# Patient Record
Sex: Male | Born: 1960 | Race: White | Hispanic: No | Marital: Married | Smoking: Current every day smoker
Health system: Southern US, Community
[De-identification: ages and names within clinical notes are randomized; demographics above are authoritative.]

## PROBLEM LIST (undated history)

## (undated) DIAGNOSIS — I4891 Unspecified atrial fibrillation: Secondary | ICD-10-CM

## (undated) DIAGNOSIS — E119 Type 2 diabetes mellitus without complications: Secondary | ICD-10-CM

## (undated) DIAGNOSIS — E785 Hyperlipidemia, unspecified: Secondary | ICD-10-CM

## (undated) DIAGNOSIS — F32A Depression, unspecified: Secondary | ICD-10-CM

## (undated) DIAGNOSIS — F329 Major depressive disorder, single episode, unspecified: Secondary | ICD-10-CM

## (undated) DIAGNOSIS — Z87898 Personal history of other specified conditions: Secondary | ICD-10-CM

## (undated) DIAGNOSIS — K2270 Barrett's esophagus without dysplasia: Secondary | ICD-10-CM

## (undated) DIAGNOSIS — G20A1 Parkinson's disease without dyskinesia, without mention of fluctuations: Secondary | ICD-10-CM

## (undated) DIAGNOSIS — I639 Cerebral infarction, unspecified: Secondary | ICD-10-CM

## (undated) DIAGNOSIS — Z86711 Personal history of pulmonary embolism: Secondary | ICD-10-CM

## (undated) DIAGNOSIS — I10 Essential (primary) hypertension: Secondary | ICD-10-CM

## (undated) DIAGNOSIS — G20 Parkinson's disease: Secondary | ICD-10-CM

## (undated) DIAGNOSIS — E079 Disorder of thyroid, unspecified: Secondary | ICD-10-CM

## (undated) DIAGNOSIS — I519 Heart disease, unspecified: Secondary | ICD-10-CM

## (undated) DIAGNOSIS — D689 Coagulation defect, unspecified: Secondary | ICD-10-CM

## (undated) DIAGNOSIS — F419 Anxiety disorder, unspecified: Secondary | ICD-10-CM

## (undated) HISTORY — DX: Anxiety disorder, unspecified: F41.9

## (undated) HISTORY — DX: Parkinson's disease: G20

## (undated) HISTORY — DX: Personal history of other specified conditions: Z87.898

## (undated) HISTORY — DX: Disorder of thyroid, unspecified: E07.9

## (undated) HISTORY — PX: KNEE ARTHROSCOPY: SUR90

## (undated) HISTORY — DX: Coagulation defect, unspecified: D68.9

## (undated) HISTORY — DX: Essential (primary) hypertension: I10

## (undated) HISTORY — PX: WRIST SURGERY: SHX841

## (undated) HISTORY — PX: BACK SURGERY: SHX140

## (undated) HISTORY — DX: Hyperlipidemia, unspecified: E78.5

## (undated) HISTORY — DX: Cerebral infarction, unspecified: I63.9

## (undated) HISTORY — PX: SPINAL CORD STIMULATOR INSERTION: SHX5378

## (undated) HISTORY — DX: Barrett's esophagus without dysplasia: K22.70

## (undated) HISTORY — DX: Parkinson's disease without dyskinesia, without mention of fluctuations: G20.A1

## (undated) HISTORY — DX: Type 2 diabetes mellitus without complications: E11.9

## (undated) HISTORY — DX: Personal history of pulmonary embolism: Z86.711

## (undated) HISTORY — DX: Depression, unspecified: F32.A

## (undated) HISTORY — DX: Unspecified atrial fibrillation: I48.91

## (undated) HISTORY — DX: Heart disease, unspecified: I51.9

## (undated) HISTORY — PX: CARDIAC CATHETERIZATION: SHX172

## (undated) HISTORY — PX: NECK SURGERY: SHX720

---
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
## 1999-06-03 ENCOUNTER — Encounter: Payer: Self-pay | Admitting: Neurosurgery

## 1999-06-05 ENCOUNTER — Inpatient Hospital Stay (HOSPITAL_COMMUNITY): Admission: RE | Admit: 1999-06-05 | Discharge: 1999-06-06 | Payer: Self-pay | Admitting: Neurosurgery

## 1999-06-05 ENCOUNTER — Encounter: Payer: Self-pay | Admitting: Neurosurgery

## 1999-11-18 ENCOUNTER — Encounter: Payer: Self-pay | Admitting: Neurosurgery

## 1999-11-18 ENCOUNTER — Ambulatory Visit (HOSPITAL_COMMUNITY): Admission: RE | Admit: 1999-11-18 | Discharge: 1999-11-18 | Payer: Self-pay | Admitting: Neurosurgery

## 2002-01-10 ENCOUNTER — Encounter: Payer: Self-pay | Admitting: Unknown Physician Specialty

## 2002-01-10 ENCOUNTER — Encounter: Admission: RE | Admit: 2002-01-10 | Discharge: 2002-01-10 | Payer: Self-pay | Admitting: Unknown Physician Specialty

## 2003-10-04 ENCOUNTER — Ambulatory Visit (HOSPITAL_COMMUNITY): Admission: RE | Admit: 2003-10-04 | Discharge: 2003-10-04 | Payer: Self-pay | Admitting: Orthopedic Surgery

## 2003-10-05 ENCOUNTER — Ambulatory Visit (HOSPITAL_BASED_OUTPATIENT_CLINIC_OR_DEPARTMENT_OTHER): Admission: RE | Admit: 2003-10-05 | Discharge: 2003-10-05 | Payer: Self-pay | Admitting: Orthopedic Surgery

## 2003-10-26 ENCOUNTER — Emergency Department (HOSPITAL_COMMUNITY): Admission: EM | Admit: 2003-10-26 | Discharge: 2003-10-26 | Payer: Self-pay | Admitting: Emergency Medicine

## 2003-11-03 ENCOUNTER — Inpatient Hospital Stay (HOSPITAL_COMMUNITY): Admission: EM | Admit: 2003-11-03 | Discharge: 2003-11-07 | Payer: Self-pay | Admitting: Emergency Medicine

## 2004-04-23 ENCOUNTER — Emergency Department (HOSPITAL_COMMUNITY): Admission: EM | Admit: 2004-04-23 | Discharge: 2004-04-23 | Payer: Self-pay | Admitting: Emergency Medicine

## 2004-07-27 ENCOUNTER — Other Ambulatory Visit: Payer: Self-pay

## 2004-09-16 ENCOUNTER — Ambulatory Visit: Payer: Self-pay | Admitting: Pain Medicine

## 2004-10-30 ENCOUNTER — Ambulatory Visit: Payer: Self-pay | Admitting: Physician Assistant

## 2004-11-26 ENCOUNTER — Ambulatory Visit: Payer: Self-pay | Admitting: Physician Assistant

## 2004-12-26 ENCOUNTER — Ambulatory Visit: Payer: Self-pay | Admitting: Physician Assistant

## 2005-01-24 ENCOUNTER — Ambulatory Visit: Payer: Self-pay | Admitting: Physician Assistant

## 2005-02-24 ENCOUNTER — Ambulatory Visit: Payer: Self-pay | Admitting: Physician Assistant

## 2005-02-26 ENCOUNTER — Ambulatory Visit: Payer: Self-pay | Admitting: Pain Medicine

## 2005-03-26 ENCOUNTER — Ambulatory Visit: Payer: Self-pay | Admitting: Physician Assistant

## 2005-04-23 ENCOUNTER — Ambulatory Visit: Payer: Self-pay | Admitting: Physician Assistant

## 2005-05-05 ENCOUNTER — Ambulatory Visit: Payer: Self-pay | Admitting: Physician Assistant

## 2005-05-14 ENCOUNTER — Ambulatory Visit: Payer: Self-pay | Admitting: Physician Assistant

## 2005-06-12 ENCOUNTER — Ambulatory Visit: Payer: Self-pay | Admitting: Physician Assistant

## 2005-07-29 ENCOUNTER — Ambulatory Visit: Payer: Self-pay | Admitting: Physician Assistant

## 2005-08-04 ENCOUNTER — Inpatient Hospital Stay (HOSPITAL_COMMUNITY): Admission: RE | Admit: 2005-08-04 | Discharge: 2005-08-05 | Payer: Self-pay | Admitting: Neurosurgery

## 2005-08-21 ENCOUNTER — Ambulatory Visit: Payer: Self-pay | Admitting: Physician Assistant

## 2005-09-24 ENCOUNTER — Ambulatory Visit: Payer: Self-pay | Admitting: Physician Assistant

## 2005-10-02 ENCOUNTER — Ambulatory Visit: Payer: Self-pay | Admitting: Physician Assistant

## 2005-10-08 ENCOUNTER — Ambulatory Visit: Payer: Self-pay | Admitting: Physician Assistant

## 2005-10-21 ENCOUNTER — Ambulatory Visit: Payer: Self-pay | Admitting: Physician Assistant

## 2005-11-04 ENCOUNTER — Ambulatory Visit: Payer: Self-pay | Admitting: Physician Assistant

## 2005-12-01 ENCOUNTER — Ambulatory Visit: Payer: Self-pay | Admitting: Physician Assistant

## 2006-01-05 ENCOUNTER — Ambulatory Visit: Payer: Self-pay | Admitting: Physician Assistant

## 2006-02-02 ENCOUNTER — Ambulatory Visit: Payer: Self-pay | Admitting: Physician Assistant

## 2006-03-02 ENCOUNTER — Ambulatory Visit: Payer: Self-pay | Admitting: Physician Assistant

## 2006-03-03 ENCOUNTER — Ambulatory Visit: Payer: Self-pay | Admitting: Pain Medicine

## 2006-03-06 ENCOUNTER — Ambulatory Visit: Payer: Self-pay | Admitting: Physician Assistant

## 2006-03-10 ENCOUNTER — Ambulatory Visit: Payer: Self-pay | Admitting: Pain Medicine

## 2006-04-03 ENCOUNTER — Ambulatory Visit: Payer: Self-pay | Admitting: Physician Assistant

## 2006-04-29 ENCOUNTER — Ambulatory Visit: Payer: Self-pay | Admitting: Physician Assistant

## 2006-05-19 ENCOUNTER — Ambulatory Visit: Payer: Self-pay | Admitting: Pain Medicine

## 2006-05-20 ENCOUNTER — Ambulatory Visit: Payer: Self-pay | Admitting: Pain Medicine

## 2006-05-28 ENCOUNTER — Ambulatory Visit: Payer: Self-pay | Admitting: Physician Assistant

## 2006-06-08 ENCOUNTER — Ambulatory Visit: Payer: Self-pay | Admitting: Pain Medicine

## 2006-06-23 ENCOUNTER — Ambulatory Visit: Payer: Self-pay | Admitting: Physician Assistant

## 2006-07-30 ENCOUNTER — Ambulatory Visit: Payer: Self-pay | Admitting: Physician Assistant

## 2006-08-04 ENCOUNTER — Ambulatory Visit: Payer: Self-pay | Admitting: Physician Assistant

## 2006-08-27 ENCOUNTER — Ambulatory Visit: Payer: Self-pay | Admitting: Physician Assistant

## 2006-09-28 ENCOUNTER — Ambulatory Visit: Payer: Self-pay | Admitting: Pain Medicine

## 2006-10-26 ENCOUNTER — Ambulatory Visit: Payer: Self-pay | Admitting: Physician Assistant

## 2006-11-24 ENCOUNTER — Ambulatory Visit: Payer: Self-pay | Admitting: Physician Assistant

## 2006-12-28 ENCOUNTER — Ambulatory Visit: Payer: Self-pay | Admitting: Physician Assistant

## 2007-01-27 ENCOUNTER — Ambulatory Visit: Payer: Self-pay | Admitting: Pain Medicine

## 2007-02-24 ENCOUNTER — Ambulatory Visit: Payer: Self-pay | Admitting: Physician Assistant

## 2007-03-24 ENCOUNTER — Ambulatory Visit: Payer: Self-pay | Admitting: Physician Assistant

## 2007-03-25 ENCOUNTER — Ambulatory Visit (HOSPITAL_BASED_OUTPATIENT_CLINIC_OR_DEPARTMENT_OTHER): Admission: RE | Admit: 2007-03-25 | Discharge: 2007-03-26 | Payer: Self-pay | Admitting: Orthopedic Surgery

## 2007-04-23 ENCOUNTER — Ambulatory Visit: Payer: Self-pay | Admitting: Physician Assistant

## 2007-05-24 ENCOUNTER — Ambulatory Visit: Payer: Self-pay | Admitting: Physician Assistant

## 2007-06-22 ENCOUNTER — Ambulatory Visit: Payer: Self-pay | Admitting: Physician Assistant

## 2007-07-21 ENCOUNTER — Ambulatory Visit: Payer: Self-pay | Admitting: Physician Assistant

## 2007-08-23 ENCOUNTER — Ambulatory Visit: Payer: Self-pay | Admitting: Physician Assistant

## 2007-09-22 ENCOUNTER — Ambulatory Visit: Payer: Self-pay | Admitting: Pain Medicine

## 2007-12-21 ENCOUNTER — Ambulatory Visit: Payer: Self-pay | Admitting: Physician Assistant

## 2008-02-09 ENCOUNTER — Ambulatory Visit: Payer: Self-pay | Admitting: Physician Assistant

## 2008-02-17 ENCOUNTER — Encounter: Admission: RE | Admit: 2008-02-17 | Discharge: 2008-02-17 | Payer: Self-pay | Admitting: Orthopedic Surgery

## 2008-03-15 ENCOUNTER — Ambulatory Visit: Payer: Self-pay | Admitting: Physician Assistant

## 2008-06-06 ENCOUNTER — Ambulatory Visit: Payer: Self-pay | Admitting: Physician Assistant

## 2008-07-18 ENCOUNTER — Ambulatory Visit: Payer: Self-pay | Admitting: Physician Assistant

## 2008-10-04 ENCOUNTER — Ambulatory Visit: Payer: Self-pay | Admitting: Pain Medicine

## 2009-01-09 ENCOUNTER — Ambulatory Visit: Payer: Self-pay | Admitting: Physician Assistant

## 2009-04-10 ENCOUNTER — Ambulatory Visit: Payer: Self-pay | Admitting: Physician Assistant

## 2009-07-11 ENCOUNTER — Ambulatory Visit: Payer: Self-pay | Admitting: Physician Assistant

## 2009-08-08 ENCOUNTER — Ambulatory Visit: Payer: Self-pay | Admitting: Physician Assistant

## 2009-10-11 ENCOUNTER — Ambulatory Visit: Payer: Self-pay | Admitting: Physician Assistant

## 2009-11-13 ENCOUNTER — Ambulatory Visit: Payer: Self-pay | Admitting: Physician Assistant

## 2009-12-12 ENCOUNTER — Ambulatory Visit: Payer: Self-pay | Admitting: Physician Assistant

## 2010-02-04 ENCOUNTER — Ambulatory Visit: Payer: Self-pay | Admitting: Pain Medicine

## 2010-05-06 ENCOUNTER — Ambulatory Visit: Payer: Self-pay | Admitting: Pain Medicine

## 2011-01-05 ENCOUNTER — Encounter: Payer: Self-pay | Admitting: Family Medicine

## 2011-05-02 NOTE — Op Note (Signed)
Charles Huynh, Charles Huynh                ACCOUNT NO.:  [id number]   MEDICAL RECORD NO.:  [id number]          PATIENT TYPE:  AMB   LOCATION:  DSC                          FACILITY:  [facility]   PHYSICIAN:  Cindee Salt, M.D.       DATE OF BIRTH:  [birth year]   DATE OF PROCEDURE:  03/25/2007  DATE OF DISCHARGE:                               OPERATIVE REPORT   PREOPERATIVE DIAGNOSIS:  Triangular fibrocartilage complex tear, left  wrist.   POSTOPERATIVE DIAGNOSES:  1. Triangular fibrocartilage complex tear, left wrist.  2. Detachment of triangular fibrocartilage from fovea, left wrist.   OPERATION:  1. Arthroscopy.  2. Debridement, triangular fibrocartilage tear, with reattachment of      triangular fibrocartilage to bone, left wrist, using arthroscopic      technique.   SURGEON:  Cindee Salt, MD   ASSISTANT:  Carolyne Fiscal.   ANESTHESIA:  Supraclavicular with local.   ANESTHESIOLOGIST:  W. Autumn Patty, MD   HISTORY:  The patient is a 50 year old male with a long history of  bilateral wrist pain.  He has undergone repair of triangle  fibrocartilage complex on his right side followed by a very significant  complex regional pain.  He is admitted now for arthroscopy of his left  with debridement pain-free TFCC tear, possible repair as dictated by  findings.   PROCEDURE:  The patient was seen in the preoperative area, questions  encouraged and answered, and preoperatively he is aware of risks and  complications, possibility of recurrence of the complex regional pain,  the possibility of continued pain, incomplete relief of symptoms, injury  to arteries, nerves, tendons, infection, dystrophy.  He has elected to  proceed to have this performed.  The extremity is marked by both the  patient and surgeon and antibiotic given.   PROCEDURE:  The patient was brought to the operating room,  where a  supraclavicular block was carried out without difficulty.  He was  prepped using DuraPrep, supine  position, left arm free.  The limb was  placed in an arthroscopy tower, 10 pounds of traction applied, the joint  inflated through the 3/4 portal.  The 3/4 portal was then opened after  localization, taking care to protect the extensor pollicis longus  tendon.  The transverse incision was made through skin only, deepened  with a hemostat, a blunt trocar was used enter the joint.  The joint was  inspected.  A small tear in the radial volar ligaments was noted.  The  cartilage showed no changes.  The scapholunate ligament was intact.  The  ulnar lunate facette showed no changes, nor did the lunate.  The  lunotriquetral joint was intact.  A triangle fibrocartilage central  transverse tear was present.  This did not appear to be degenerative in  nature.  The cartilage showed no trampoline effect and was extremely  floppy.  Irrigation catheter was placed in 6U.  A 6R portal was opened,  a probe entered.  This was then used to probe the triangle  fibrocartilage.  There was no trampoline effect present.  This cartilage  was  found to be attached at the periphery.  The midcarpal joint was then  inspected through the ulnar midcarpal portal after localization and  inflation with a 22-gauge needle.  The midcarpal joint was inspected and  no significant laxity of either the lunotriquetral or scapholunate  ligament was noted.  There were no cartilaginous changes present.  The  scope was reintroduced.  A 4/5 portal was then opened after localization  with a 22-gauge needle.  A full radius shaver and Arthrowand were then  introduced and used to debride the triangle fibrocartilage complex.  Significant instability was noted to the distal radioulnar joint.  Once  this was completed, which was present preoperatively, once anesthesia  was afforded, the fovea was then probed.  This was found to be  incompetent and it was decided to proceed with reattachment.  The area  of the fovea was then roughened with a  full radius shaver and  Arthrowand.  Under image intensification, two 18-gauge needles were then  passed through the ulnar aspect of the distal ulna just proximal to the  styloid and noted to enter up through the triangle fibrocartilage  complex.  A 28-gauge needle was then used to clear the bone from the  needles.  A 30-gauge monofilament wire doubled over for a loop was then  passed through one and a 2-0 PDS suture passed through the opposite.  A  grasper was then introduced and both suture and wire loop were brought  out through the 4/5 portal, looped through the skin.  The needles  removed and the wire was then used to deliver the PDS suture through the  drill holes.  An incision was then made on the ulnar side of the wrist  at the distal ulna between the two sutures.  This was bluntly dissected  down to bone, then along the bone the two sutures were then brought into  this wound and tied over the ulna.  This was done while observing the  tightening of the triangular fibrocartilage complex back into the fovea.  Significant stability was then afforded to the distal radioulnar joint  on testing.  The sutures were cut.  A full radius shaver was then  introduced.  The two bone pieces which were forced out through the  needles were removed and a smoothing of the triangular fibrocartilage  complex was completed.  The instruments were removed.  The portals were  closed with interrupted 5-0 Vicryl Rapide sutures.  A sterile  compressive dressing and a long-arm splint applied.  The patient  tolerated the procedure well and was taken to the recovery room for  observation in satisfactory condition.  He is admitted for overnight  stay.  He will be blocked again in the morning in an effort to prevent  his complex regional pain from recurring.  He will be discharged on  Percocet to return to the [facility] in 1 week.          ______________________________  Cindee Salt,  M.D.     GK/MEDQ  D:  03/25/2007  T:  03/25/2007  Job:  [id number]

## 2011-05-02 NOTE — Op Note (Signed)
NAMELUQMAN, PERRELLI                ACCOUNT NO.:  [id number]   MEDICAL RECORD NO.:  [id number]          PATIENT TYPE:  INP   LOCATION:  2866                         FACILITY:  [facility]   PHYSICIAN:  Cristi Loron, M.D.DATE OF BIRTH:  Jul 24, 1961   DATE OF PROCEDURE:  08/04/2005  DATE OF DISCHARGE:                                 OPERATIVE REPORT   CONTINUATION:  We incised the C5-6 intervertebral disk with a 15 blade  scalpel and then performed partial diskectomy using pituitary forceps and  Carlens curettes.  We inserted distraction screws at C5 and C6 and then  distracted the interspace.  We then used a high speed drill to decorticate  the vertebral end plate at U9-8 to drill away the remainder of C5-6  intervertebral disk, to thin out the posterior longitudinal ligament and  drill away some posterior spondylosis.  We then incised the ligament with  the arachnoid knife and then removed it with a Kerrison punch undercutting  the vertebral end plates at C6, decompressing the thecal sac.  I then  performed a generous foraminotomy around the bilateral C6 nerve roots  completing the decompression.   We now turned our attention to the arthrodesis, obtained iliac crest  tricortical allograft bone graft and fashioned it to these approximate  dimensions.   DICTATION ENDED HERE.      Cristi Loron, M.D.  Electronically Signed     JDJ/MEDQ  D:  08/04/2005  T:  08/05/2005  Job:  [id number]

## 2011-05-02 NOTE — Op Note (Signed)
[name], [name]                ACCOUNT NO.:  [id number]   MEDICAL RECORD NO.:  [id number]          PATIENT TYPE:  INP   LOCATION:  2866                         FACILITY:  [facility]   PHYSICIAN:  Lonna Cobb, N.P.      DATE OF BIRTH:  18-Mar-1961   DATE OF PROCEDURE:  08/04/2005  DATE OF DISCHARGE:                                 OPERATIVE REPORT   CONTINUATION:  We then performed a generous foraminotomy about the bilateral  C6 nerve roots completing the decompression.   We now turned attention to the arthrodesis.  We obtained iliac crest  tricortical allograft bone graft and fashioned these into these approximate  dimensions.  8 mm height, 1 cm depth. We inserted the bone graft into the  distracted C5-6 interspace and then removed the distraction screws.  There  was good snug fit of the bone graft.   We now turned our attention to anterior spinal instrumentation.  We used a  high speed drill to remove some ventral spondylosis from the C5-6 interspace  so that the plate would lie down flat.  We selected the appropriate length  Codman Slim Lock anterior cervical plate.  We laid it along the anterior  aspect of the vertebral bodies at C5-C6.  We placed a 14 mm self tapping  screw at C7 on the left.  Because the previous screw was somewhat crooked on  cervical spine, we decided to drill a new hole at C7 on left.  We drilled a  14 mm hole and then placed a second 14 mm self-tapping screw at this level.  Because it was near to the previous hole, we used a rescue screw but there  was good bony purchase.  We then drilled two more 14 mm holes at C5 and  placed two 14 mm self tapping screws at C5 completing instrumentation.  We  obtained an intraoperative radiograph and there was limited visualization on  that radiograph; however, it looked good in vivo.  We there secured the  screws to the plate by locking each cam.   We now carefully provided hemostasis by using bipolar electrocautery.  We  removed the Caspar self-retaining retractor and inspected the esophagus.  There was no apparent damage.  We then reapproximated the patient's platysma  muscle with interrupted 3-0 Vicryl suture, subcutaneous tissue with  interrupted 3-0 Vicryl suture and the skin with Steri-Strips and benzoin.  The wound was then coated with bacitracin ointment and sterile dressing  applied.  The drapes were removed.  The patient was subsequently extubated  by the anesthesia team and transported to the post anesthesia care unit in  stable condition.  All sponge, needle and instrument counts were correct at  the end of this case.      Lonna Cobb, N.P.     LT/MEDQ  D:  08/04/2005  T:  08/05/2005  Job:  [id number]

## 2011-05-02 NOTE — Discharge Summary (Signed)
NAMETREVONNE, NYLAND                            ACCOUNT NO.:  [id number]   MEDICAL RECORD NO.:  [id number]                   PATIENT TYPE:  INP   LOCATION:                                       FACILITY:  [facility]   PHYSICIAN:  Cindee Salt, M.D.                    DATE OF BIRTH:  1961-10-30   DATE OF ADMISSION:  11/03/2003  DATE OF DISCHARGE:  11/07/2003                                 DISCHARGE SUMMARY   ADMISSION DIAGNOSIS:  Pain and swelling, possible infection right wrist,  status post arthroscopy.   DISCHARGE DIAGNOSIS:  Pain and swelling, possible infection right wrist,  status post arthroscopy.   OPERATION:  None.   HISTORY:  The patient is a 50 year old male with a history of arthroscopy of  his wrist.  He has had progressive pain and swelling of his wrist, along  with increasing discomfort.  Pull out sutures were removed.  He is admitted  for observation, antibiotics and laboratory work.   PHYSICAL EXAMINATION:  GENERAL:  Well-developed, well-nourished male in no  acute distress.  CHEST:  Clear.  HEART:  Regular.  EXTREMITIES:  Swelling of wrist.   HOSPITAL COURSE:  He was admitted.  CBC was 8.6, sedimentation rate was 8.  He was placed on Ancef.  The swelling diminished progressively, pain  diminished.  He showed good improvement.  He is discharged on November 07, 2003.   DISCHARGE MEDICATIONS:  Vicodin and Keflex.   FOLLOWUP:  To return to the office.                                                Cindee Salt, M.D.    Angelique Blonder  D:  12/18/2003  T:  12/18/2003  Job:  [id number]

## 2011-05-02 NOTE — Op Note (Signed)
Charles Huynh, Charles Huynh                ACCOUNT NO.:  [id number]   MEDICAL RECORD NO.:  [id number]          PATIENT TYPE:  INP   LOCATION:  2866                         FACILITY:  [facility]   PHYSICIAN:  Cristi Loron, M.D.DATE OF BIRTH:  06-14-1961   DATE OF PROCEDURE:  08/04/2005  DATE OF DISCHARGE:                                 OPERATIVE REPORT   BRIEF HISTORY:  The patient is a 50 year old white male who has previously  undergone a C6-7 anterior cervical discectomy, fusion and plating by another  physician approximately six years ago.  He did well with that surgery, but  subsequently was injured in a work related injury, suffering a severe  shoulder injury as well as a C5-6 herniated disc.  He failed medical  management of his herniated disc and he is also considered a candidate for a  cervical epidural spinal cord stimulator.  I discussed the situation with  him and recommended and consider a C5-6 anterior cervical discectomy and  fusion and plating.  The patient weighed the risks, benefits, and  alternatives to surgery and decided to proceed with a C5-6 anterior cervical  discectomy, fusion and plating.   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   POSTOPERATIVE DIAGNOSIS:  C5-6 herniated nucleus pulposus, stenosis,  cervical radiculopathy, cervicalgia.   PROCEDURE:  C5-6 extensive anterior cervical discectomy/decompression;  interbody iliac crest allograft arthrodesis; anterior cervical plating  (Codman's SLIM-LOC titanium plate and screws).   SURGEON:  Cristi Loron, M.D.   ASSISTANT:  Hilda Lias, M.D.   ANESTHESIA:  General endotracheal anesthesia.   ESTIMATED BLOOD LOSS:  50 mL.   SPECIMENS:  None.   DRAINS:  None.   COMPLICATIONS:  None.   DESCRIPTION OF PROCEDURE:  The patient was brought to the operating room by  anesthesia team.  General endotracheal anesthesia was induced.  Patient  remained in the supine position.  A roll was placed under his shoulders to  place the neck in slight extension.  His anterior cervical region was then  shaved and prepared with Betadine scrub and Betadine solution.  Sterile  drapes were applied.  I then injected the area to be incised with Marcaine  with epinephrine solution.  I used a scalpel to make a transverse incision  in his left anterior neck; i.e., through his previous surgical incision.  I  used the Metzenbaum scissors to dissect through the scar tissue to divide  the platysma muscle and carefully dissect medial to sternocleidomastoid  muscle, jugular vein and carotid artery.  I carefully dissected down toward  the anterior cervical spine, carefully identifying the esophagus and  retracting medially with the hand-held retractors and clearing the soft  tissue from the anterior cervical spine, exposing underlying scar tissue  from the prior surgery.  I inserted a bent spinal needle and exposed disc  space and obtained intraoperative radiograph to confirm our location.  I  then used the 15 blade scalpel to remove some of the scar tissue from over  the pervious plate at W4-1.  I then used the screwdriver to  remove the  interlocking screw and then the screw, then the plate.  I then used  electrocautery to detach the medial border of the longus colli muscle  bilaterally from C5-6 intervertebral disc space.  I inserted the Caspar self-  retaining retractor for exposure.  We began the decompression by incising  the C5-6 intervertebral disc space with a 15 blade scalpel and performed a  partial discectomy using the pituitary forceps and the Karlin curettes.  We  inserted distraction screws at the ...   Dictation ended at this point.      Cristi Loron, M.D.  Electronically Signed     JDJ/MEDQ  D:  08/04/2005  T:  08/05/2005  Job:  [id number]

## 2015-12-28 DIAGNOSIS — N138 Other obstructive and reflux uropathy: Secondary | ICD-10-CM | POA: Insufficient documentation

## 2015-12-28 DIAGNOSIS — N401 Enlarged prostate with lower urinary tract symptoms: Secondary | ICD-10-CM

## 2015-12-28 HISTORY — DX: Benign prostatic hyperplasia with lower urinary tract symptoms: N13.8

## 2015-12-28 HISTORY — DX: Benign prostatic hyperplasia with lower urinary tract symptoms: N40.1

## 2017-07-09 DIAGNOSIS — G20 Parkinson's disease: Secondary | ICD-10-CM | POA: Insufficient documentation

## 2017-07-09 DIAGNOSIS — I639 Cerebral infarction, unspecified: Secondary | ICD-10-CM | POA: Insufficient documentation

## 2017-07-09 DIAGNOSIS — E039 Hypothyroidism, unspecified: Secondary | ICD-10-CM

## 2017-07-09 DIAGNOSIS — E119 Type 2 diabetes mellitus without complications: Secondary | ICD-10-CM | POA: Insufficient documentation

## 2017-07-09 DIAGNOSIS — R7989 Other specified abnormal findings of blood chemistry: Secondary | ICD-10-CM | POA: Insufficient documentation

## 2017-07-09 DIAGNOSIS — G894 Chronic pain syndrome: Secondary | ICD-10-CM | POA: Insufficient documentation

## 2017-07-09 DIAGNOSIS — I4891 Unspecified atrial fibrillation: Secondary | ICD-10-CM | POA: Insufficient documentation

## 2017-07-09 DIAGNOSIS — G20A1 Parkinson's disease without dyskinesia, without mention of fluctuations: Secondary | ICD-10-CM | POA: Insufficient documentation

## 2017-07-09 HISTORY — DX: Type 2 diabetes mellitus without complications: E11.9

## 2017-07-09 HISTORY — DX: Chronic pain syndrome: G89.4

## 2017-07-09 HISTORY — DX: Cerebral infarction, unspecified: I63.9

## 2017-07-09 HISTORY — DX: Hypothyroidism, unspecified: E03.9

## 2017-07-09 HISTORY — DX: Other specified abnormal findings of blood chemistry: R79.89

## 2017-09-02 DIAGNOSIS — K219 Gastro-esophageal reflux disease without esophagitis: Secondary | ICD-10-CM | POA: Insufficient documentation

## 2017-09-02 HISTORY — DX: Gastro-esophageal reflux disease without esophagitis: K21.9

## 2019-08-30 ENCOUNTER — Ambulatory Visit (INDEPENDENT_AMBULATORY_CARE_PROVIDER_SITE_OTHER): Payer: Medicare Other | Admitting: Cardiology

## 2019-08-30 ENCOUNTER — Encounter: Payer: Self-pay | Admitting: Cardiology

## 2019-08-30 ENCOUNTER — Other Ambulatory Visit: Payer: Self-pay

## 2019-08-30 VITALS — BP 116/64 | HR 64 | Ht 71.0 in | Wt 173.0 lb

## 2019-08-30 DIAGNOSIS — E1140 Type 2 diabetes mellitus with diabetic neuropathy, unspecified: Secondary | ICD-10-CM

## 2019-08-30 DIAGNOSIS — Z01812 Encounter for preprocedural laboratory examination: Secondary | ICD-10-CM | POA: Diagnosis not present

## 2019-08-30 DIAGNOSIS — I10 Essential (primary) hypertension: Secondary | ICD-10-CM | POA: Insufficient documentation

## 2019-08-30 DIAGNOSIS — R0600 Dyspnea, unspecified: Secondary | ICD-10-CM | POA: Diagnosis not present

## 2019-08-30 DIAGNOSIS — R079 Chest pain, unspecified: Secondary | ICD-10-CM | POA: Diagnosis not present

## 2019-08-30 DIAGNOSIS — I4891 Unspecified atrial fibrillation: Secondary | ICD-10-CM | POA: Diagnosis not present

## 2019-08-30 DIAGNOSIS — Z8249 Family history of ischemic heart disease and other diseases of the circulatory system: Secondary | ICD-10-CM

## 2019-08-30 HISTORY — DX: Essential (primary) hypertension: I10

## 2019-08-30 MED ORDER — NITROGLYCERIN 0.4 MG SL SUBL: 0.4000 mg | SUBLINGUAL_TABLET | SUBLINGUAL | 5 refills | Status: DC | PRN

## 2019-08-30 NOTE — Progress Notes (Signed)
Cardiology Office Note:    Date:  08/31/2019   ID:  Charles Huynh, DOB 10/01/1961, MRN [id number]  PCP:  Patient, No Pcp Per  Cardiologist:  No primary care provider on file.  Electrophysiologist:  None   Referring MD: No ref. provider found   The patient was referred by his primary care physician for chest discomfort and palpitations.  History of Present Illness:    Charles Huynh is a 58 y.o. male with a hx of paroxysmal atrial fibrillation (he is on Eliquis), hypertension, diabetes not on insulin, hyperlipidemia, chronic smoker presents to be evaluated for chest discomfort.  Patient for the last 2 weeks he has been experiencing chest discomfort with sexual intercourse.  He notes that in the middle sexual intercourse he starts to get mid chest diffuse chest pressure with associated shortness of breath.  He notes that once this started he gets an abrupt onset of palpitations.  He states that whenever he stop and rest it takes about 10 to 15 minutes and resolves.  He denies any aggravating or relieving factors. Of note the patient states that he thinks that at this time he is going back into atrial fibrillation as he feels exactly like when it used to be when he was in and out of A. Fib.  No chest discomfort in the office today.    Past Medical History:  Diagnosis Date  . Atrial fibrillation (HCC)   . Hx pulmonary embolism   . Hyperlipidemia   . Hypertension   . Parkinson's disease (HCC)   . Stroke (HCC)   . Thyroid disease     Past Surgical History:  Procedure Laterality Date  . BACK SURGERY    . CARDIAC CATHETERIZATION    . KNEE ARTHROSCOPY Left   . NECK SURGERY    . SPINAL CORD STIMULATOR INSERTION    . WRIST SURGERY Bilateral     Current Medications: Current Meds  Medication Sig  . baclofen (LIORESAL) 10 MG tablet TK 1 T PO BID  . carbidopa-levodopa (SINEMET CR) 50-200 MG tablet TK 2 TS PO BID  . citalopram (CELEXA) 40 MG tablet TK 1 T PO QD  . DILT-XR 120 MG 24  hr capsule TK 1 C PO QD  . diphenhydrAMINE (BENADRYL) 25 mg capsule Take 25 mg by mouth at bedtime.  Marland Kitchen ELIQUIS 5 MG TABS tablet Take 5 mg by mouth 2 (two) times daily.  Marland Kitchen esomeprazole (NEXIUM) 40 MG capsule Take 40 mg by mouth daily at 12 noon.  . fenofibrate 160 MG tablet TK 1 T PO QD  . fentaNYL (DURAGESIC) 50 MCG/HR 1 patch as directed. Apply 1 patch every 2.5 days  . hydrochlorothiazide (MICROZIDE) 12.5 MG capsule TK 1 C PO QD  . JANUVIA 100 MG tablet Take 100 mg by mouth daily.  Marland Kitchen levothyroxine (SYNTHROID) 50 MCG tablet TK 1 T PO QD  . loratadine (CLARITIN) 10 MG tablet Take 10 mg by mouth daily.  Marland Kitchen lovastatin (MEVACOR) 20 MG tablet Take 1 tablet by mouth daily.  . metFORMIN (GLUCOPHAGE) 500 MG tablet TAKE 2 TABLETS PO BID  . metoCLOPramide (REGLAN) 10 MG tablet TK 1 T PO BID  . metoprolol tartrate (LOPRESSOR) 100 MG tablet TAKE 1 TABLET PO BID  . oxyCODONE-acetaminophen (PERCOCET) 10-325 MG tablet Take 1 tablet by mouth 3 (three) times daily.  . potassium chloride (K-DUR) 10 MEQ tablet TK 1 T PO QD  . pregabalin (LYRICA) 300 MG capsule TK ONE C PO Q 12 H  .  promethazine (PHENERGAN) 25 MG tablet Take 25 mg by mouth every 6 (six) hours as needed for nausea or vomiting.  Marland Kitchen TRIAZOLAM PO Take 1 tablet by mouth at bedtime.     Allergies:   Aleve [naproxen sodium]   Social History   Socioeconomic History  . Marital status: Married    Spouse name: Not on file  . Number of children: Not on file  . Years of education: Not on file  . Highest education level: Not on file  Occupational History  . Not on file  Social Needs  . Financial resource strain: Not on file  . Food insecurity    Worry: Not on file    Inability: Not on file  . Transportation needs    Medical: Not on file    Non-medical: Not on file  Tobacco Use  . Smoking status: Current Every Day Smoker    Packs/day: 1.00    Years: 47.00    Pack years: 47.00    Types: Cigarettes  . Smokeless tobacco: Never Used   Substance and Sexual Activity  . Alcohol use: Yes    Comment: very seldom; 1 -2 times per year  . Drug use: Never  . Sexual activity: Not on file  Lifestyle  . Physical activity    Days per week: Not on file    Minutes per session: Not on file  . Stress: Not on file  Relationships  . Social Musician on phone: Not on file    Gets together: Not on file    Attends religious service: Not on file    Active member of club or organization: Not on file    Attends meetings of clubs or organizations: Not on file    Relationship status: Not on file  Other Topics Concern  . Not on file  Social History Narrative  . Not on file     Family History: The patient's family history includes Emphysema in his father; Heart attack in his father; Heart disease in his father; Lung cancer in his mother.  ROS:   .   Review of Systems  Constitution: Negative for decreased appetite and weight loss.  Eyes: Negative for blurred vision, redness and vision loss in right eye.  Cardiovascular: Positive for chest pain and palpitations. Negative for dyspnea on exertion.  Respiratory: Negative for cough, snoring and wheezing.   Endocrine: Negative for heat intolerance and polydipsia.  Skin: Negative for itching, nail changes and skin cancer.  Musculoskeletal: Negative for arthritis, joint pain and muscle cramps.  Gastrointestinal: Negative for bloating, diarrhea, flatus, heartburn, nausea and vomiting.  Genitourinary: Negative for flank pain and incomplete emptying.  Neurological: Negative for difficulty with concentration, focal weakness and headaches.  Psychiatric/Behavioral: Negative for depression, memory loss and substance abuse.  Allergic/Immunologic: Negative for HIV exposure and hives.     EKGs/Labs/Other Studies Reviewed:    The following studies were reviewed today:   EKG: The ekg ordered today demonstrates sinus rhythm, heart rate 68 bpm, with underlying left bundle branch block,  and evidence of left atrial enlargement.  Left bundle branch block present on EKG performed in August 2005.  Recent Labs: No recent labs Recent Lipid Panel No recent lipid panel.  Physical Exam:    VS:  BP 116/64 (BP Location: Right Arm, Patient Position: Sitting, Cuff Size: Normal)   Pulse 64   Ht 5\' 11"  (1.803 m)   Wt 173 lb (78.5 kg)   SpO2 95%   BMI 24.13  kg/m     Wt Readings from Last 3 Encounters:  08/30/19 173 lb (78.5 kg)     GEN: Well nourished, well developed in no acute distress HEENT: Normal NECK: No JVD; No carotid bruits LYMPHATICS: No lymphadenopathy CARDIAC: RRR, no murmurs, rubs, gallops RESPIRATORY:  Clear to auscultation without rales, wheezing or rhonchi  ABDOMEN: Soft, non-tender, non-distended EXTREMITIES: No cyanosis, no clubbing, no edema MUSCULOSKELETAL:  No edema; No deformity  SKIN: Warm and dry, tattoo on left lower extremity NEUROLOGIC:  Alert and oriented x 3 PSYCHIATRIC:  Normal affect, good insight  ASSESSMENT:    1. Atrial fibrillation, unspecified type (HCC)   2. Chest pain, unspecified type   3. Dyspnea, unspecified type   4. Pre-procedure lab exam   5. Essential hypertension   6. Family history of coronary artery disease   7. Type 2 diabetes mellitus with diabetic neuropathy, without long-term current use of insulin (HCC)    PLAN:    At this time I am going to go ahead and pursue an ischemic evaluation in this patient who is at risk for coronary artery disease. I have a CTA of the coronaries and he is in agreement to this testing. He denies allergies to IV contrast. Risk and benefit review with patient.  A TTE is also appropriate in the setting of his shortness of breath as well as longstanding hypertension to assess RV/LV function and structural abnormalities.   A 7-day ZIO patch has been ordered to assess for any heart rate scratching as well as rhythm changes.  For now sublingual nitroglycerin as needed will be ordered.  I have  counseled patient on the use of the nitroglycerin as well as to avoid using any phosphodiesterase inhibitors ( like Viagra or Cialis).  He denies the use of any of these medication.  Blood work has been ordered, he will get this done the same day is scheduled for his TTE.  He is a current smoker and is willing to quit smoking - for now he has set a goal to quit smoking before christmas.   He is in agreement with the above plan.  Follow-up in 3 months.   Medication Adjustments/Labs and Tests Ordered: Current medicines are reviewed at length with the patient today.  Concerns regarding medicines are outlined above.  Orders Placed This Encounter  Procedures  . CT CORONARY FRACTIONAL FLOW RESERVE DATA PREP  . CT CORONARY FRACTIONAL FLOW RESERVE FLUID ANALYSIS  . CT CORONARY MORPH W/CTA COR W/SCORE W/CA W/CM &/OR WO/CM  . Basic Metabolic Panel (BMET)  . Basic Metabolic Panel (BMET)  . Magnesium  . TSH  . Lipid Profile  . HgB A1c  . LONG TERM MONITOR (3-14 DAYS)  . EKG 12-Lead  . ECHOCARDIOGRAM COMPLETE   Meds ordered this encounter  Medications  . nitroGLYCERIN (NITROSTAT) 0.4 MG SL tablet    Sig: Place 1 tablet (0.4 mg total) under the tongue every 5 (five) minutes as needed for chest pain.    Dispense:  25 tablet    Refill:  5    Patient Instructions  Medication Instructions:  Nitroglycerin 0.4 mg sublingual (under your tongue) as needed for chest pain. If experiencing chest pain, stop what you are doing and sit down. Take 1 nitroglycerin and wait 5 minutes. If chest pain continues, take another nitroglycerin and wait 5 minutes. If chest pain does not subside, take 1 more nitroglycerin and dial 911. You make take a total of 3 nitroglycerin in a 15 minute time  frame.  If you need a refill on your cardiac medications before your next appointment, please call your pharmacy.   Lab work: Your physician recommends that you return for lab work in:   3-7 days prior to CT: BMP  When you  have your echo: BMP,TSH,LIPID,Magnesium  If you have labs (blood work) drawn today and your tests are completely normal, you will receive your results only by: Marland Kitchen. MyChart Message (if you have MyChart) OR . A paper copy in the mail If you have any lab test that is abnormal or we need to change your treatment, we will call you to review the results.  Testing/Procedures: Your physician has requested that you have an echocardiogram. Echocardiography is a painless test that uses sound waves to create images of your heart. It provides your doctor with information about the size and shape of your heart and how well your heart's chambers and valves are working. This procedure takes approximately one hour. There are no restrictions for this procedure.  Your physician has recommended that you wear a ZIO monitor. ZIO monitors are medical devices that record the heart's electrical activity. Doctors most often use these monitors to diagnose arrhythmias. Arrhythmias are problems with the speed or rhythm of the heartbeat. The monitor is a small, portable device. You can wear one while you do your normal daily activities. This is usually used to diagnose what is causing palpitations/syncope (passing out).  Wear 7 days  Your physician has requested that you have cardiac CT. Cardiac computed tomography (CT) is a painless test that uses an x-ray machine to take clear, detailed pictures of your heart. For further information please visit [email or web address] Please follow instruction sheet as given.  Your cardiac CT will be scheduled at one of the below locations:   [facility] [address] [place], [place] [address] [phone]  If scheduled at [facility], please arrive at the [facility] main entrance of [facility] 30-45 minutes prior to test start time. Proceed to the [facility] Radiology Department (first floor) to check-in and test prep.  .  Please follow these  instructions carefully (unless otherwise directed):  Hold all erectile dysfunction medications at least 3 days (72 hrs) prior to test.  On the Night Before the Test: . Be sure to Drink plenty of water. . Do not consume any caffeinated/decaffeinated beverages or chocolate 12 hours prior to your test. . Do not take any antihistamines 12 hours prior to your test.   On the Day of the Test: . Drink plenty of water. Do not drink any water within one hour of the test. . Do not eat any food 4 hours prior to the test. . You may take your regular medications prior to the test.  . Take metoprolol (Lopressor) two hours prior to test. . HOLD Furosemide/Hydrochlorothiazide morning of the test.                  -If HR is less than 55 BPM- No Beta Blocker                -IF HR is greater than 55 BPM and patient is less than or equal to 60 yrs old Lopressor 100mg  x1.      After the Test: . Drink plenty of water. . After receiving IV contrast, you may experience a mild flushed feeling. This is normal. . On occasion, you may experience a mild rash up to 24 hours after the test. This  is not dangerous. If this occurs, you can take Benadryl 25 mg and increase your fluid intake. . If you experience trouble breathing, this can be serious. If it is severe call 911 IMMEDIATELY. If it is mild, please call our office. . If you take any of these medications: Glipizide/Metformin, Avandament, Glucavance, please do not take 48 hours after completing test unless otherwise instructed.    Please contact the cardiac imaging nurse navigator should you have any questions/concerns Marchia Bond, RN Navigator Cardiac Imaging [facility] [phone] Office  [phone] Cell   Follow-Up: At [facility], you and your health needs are our priority.  As part of our continuing mission to provide you with exceptional heart care, we have created designated Provider Care Teams.  These Care Teams  include your primary Cardiologist (physician) and Advanced Practice Providers (APPs -  Physician Assistants and Nurse Practitioners) who all work together to provide you with the care you need, when you need it. You will need a follow up appointment in 3 months with Dr Colbe Viviano/ Any Other Special Instructions Will Be Listed Below (If Applicable).       Rolly Pancake, DO  08/31/2019 9:19 AM    [facility]

## 2019-08-30 NOTE — Patient Instructions (Addendum)
Medication Instructions:  Nitroglycerin 0.4 mg sublingual (under your tongue) as needed for chest pain. If experiencing chest pain, stop what you are doing and sit down. Take 1 nitroglycerin and wait 5 minutes. If chest pain continues, take another nitroglycerin and wait 5 minutes. If chest pain does not subside, take 1 more nitroglycerin and dial 911. You make take a total of 3 nitroglycerin in a 15 minute time frame.  If you need a refill on your cardiac medications before your next appointment, please call your pharmacy.   Lab work: Your physician recommends that you return for lab work in:   3-7 days prior to CT: BMP  When you have your echo: BMP,TSH,LIPID,Magnesium  If you have labs (blood work) drawn today and your tests are completely normal, you will receive your results only by: Marland Kitchen. MyChart Message (if you have MyChart) OR . A paper copy in the mail If you have any lab test that is abnormal or we need to change your treatment, we will call you to review the results.  Testing/Procedures: Your physician has requested that you have an echocardiogram. Echocardiography is a painless test that uses sound waves to create images of your heart. It provides your doctor with information about the size and shape of your heart and how well your heart's chambers and valves are working. This procedure takes approximately one hour. There are no restrictions for this procedure.  Your physician has recommended that you wear a ZIO monitor. ZIO monitors are medical devices that record the heart's electrical activity. Doctors most often use these monitors to diagnose arrhythmias. Arrhythmias are problems with the speed or rhythm of the heartbeat. The monitor is a small, portable device. You can wear one while you do your normal daily activities. This is usually used to diagnose what is causing palpitations/syncope (passing out).  Wear 7 days  Your physician has requested that you have cardiac CT. Cardiac  computed tomography (CT) is a painless test that uses an x-ray machine to take clear, detailed pictures of your heart. For further information please visit [email or web address] Please follow instruction sheet as given.  Your cardiac CT will be scheduled at one of the below locations:   [facility] [address] [place], [place] [address] [phone]  If scheduled at [facility], please arrive at the [facility] main entrance of [facility] 30-45 minutes prior to test start time. Proceed to the [facility] Radiology Department (first floor) to check-in and test prep.  .  Please follow these instructions carefully (unless otherwise directed):  Hold all erectile dysfunction medications at least 3 days (72 hrs) prior to test.  On the Night Before the Test: . Be sure to Drink plenty of water. . Do not consume any caffeinated/decaffeinated beverages or chocolate 12 hours prior to your test. . Do not take any antihistamines 12 hours prior to your test.   On the Day of the Test: . Drink plenty of water. Do not drink any water within one hour of the test. . Do not eat any food 4 hours prior to the test. . You may take your regular medications prior to the test.  . Take metoprolol (Lopressor) two hours prior to test. . HOLD Furosemide/Hydrochlorothiazide morning of the test.                  -If HR is less than 55 BPM- No Beta Blocker                -  IF HR is greater than 55 BPM and patient is less than or equal to 45 yrs old Lopressor 100mg  x1.      After the Test: . Drink plenty of water. . After receiving IV contrast, you may experience a mild flushed feeling. This is normal. . On occasion, you may experience a mild rash up to 24 hours after the test. This is not dangerous. If this occurs, you can take Benadryl 25 mg and increase your fluid intake. . If you experience trouble breathing, this can be serious. If it is severe call 911 IMMEDIATELY. If it is  mild, please call our office. . If you take any of these medications: Glipizide/Metformin, Avandament, Glucavance, please do not take 48 hours after completing test unless otherwise instructed.    Please contact the cardiac imaging nurse navigator should you have any questions/concerns Marchia Bond, RN Navigator Cardiac Imaging [facility] [phone] Office  [phone] Cell   Follow-Up: At [facility], you and your health needs are our priority.  As part of our continuing mission to provide you with exceptional heart care, we have created designated Provider Care Teams.  These Care Teams include your primary Cardiologist (physician) and Advanced Practice Providers (APPs -  Physician Assistants and Nurse Practitioners) who all work together to provide you with the care you need, when you need it. You will need a follow up appointment in 3 months with Dr Tobb/ Any Other Special Instructions Will Be Listed Below (If Applicable).

## 2019-08-31 ENCOUNTER — Encounter: Payer: Self-pay | Admitting: Cardiology

## 2019-09-05 ENCOUNTER — Ambulatory Visit (INDEPENDENT_AMBULATORY_CARE_PROVIDER_SITE_OTHER): Payer: Medicare Other

## 2019-09-05 DIAGNOSIS — I4891 Unspecified atrial fibrillation: Secondary | ICD-10-CM | POA: Diagnosis not present

## 2019-09-30 ENCOUNTER — Other Ambulatory Visit: Payer: Medicare Other

## 2019-10-07 ENCOUNTER — Telehealth (HOSPITAL_COMMUNITY): Payer: Self-pay | Admitting: Emergency Medicine

## 2019-10-07 NOTE — Telephone Encounter (Signed)
Reaching out to patient to offer assistance regarding upcoming cardiac imaging study; pt verbalizes understanding of appt date/time, parking situation and where to check in, pre-test NPO status and medications ordered, and verified current allergies; name and call back number provided for further questions should they arise Tanise Russman RN Navigator Cardiac Imaging [facility] [phone] office [phone] cell 

## 2019-10-10 ENCOUNTER — Other Ambulatory Visit: Payer: Self-pay

## 2019-10-10 ENCOUNTER — Ambulatory Visit (HOSPITAL_COMMUNITY)
Admission: RE | Admit: 2019-10-10 | Discharge: 2019-10-10 | Disposition: A | Discharge status: Home / Self Care | Payer: Medicare Other | Source: Ambulatory Visit | Attending: Cardiology | Admitting: Cardiology

## 2019-10-10 DIAGNOSIS — I2510 Atherosclerotic heart disease of native coronary artery without angina pectoris: Secondary | ICD-10-CM | POA: Diagnosis not present

## 2019-10-10 DIAGNOSIS — R079 Chest pain, unspecified: Secondary | ICD-10-CM | POA: Insufficient documentation

## 2019-10-10 LAB — POCT I-STAT CREATININE: Creatinine, Ser: 1 mg/dL (ref 0.61–1.24)

## 2019-10-10 MED ORDER — IOHEXOL 350 MG/ML SOLN
100.0000 mL | Freq: Once | INTRAVENOUS | Status: AC | PRN
  Administered 2019-10-10: 100 mL via INTRAVENOUS

## 2019-10-10 MED ORDER — NITROGLYCERIN 0.4 MG SL SUBL
0.8000 mg | SUBLINGUAL_TABLET | Freq: Once | SUBLINGUAL | Status: AC
  Administered 2019-10-10: 0.8 mg via SUBLINGUAL

## 2019-10-10 MED ORDER — NITROGLYCERIN 0.4 MG SL SUBL
SUBLINGUAL_TABLET | SUBLINGUAL | Status: AC
  Filled 2019-10-10: qty 2

## 2019-10-10 MED ORDER — METOPROLOL TARTRATE 5 MG/5ML IV SOLN: 5.0000 mg | INTRAVENOUS | Status: DC | PRN

## 2019-10-10 NOTE — Progress Notes (Signed)
CT scan completed. Tolerated well. D/C home with wife, awake and alert. In no distress. 

## 2019-10-11 ENCOUNTER — Telehealth: Payer: Self-pay | Admitting: Cardiology

## 2019-10-11 DIAGNOSIS — R079 Chest pain, unspecified: Secondary | ICD-10-CM | POA: Diagnosis not present

## 2019-10-11 NOTE — Telephone Encounter (Signed)
Having SOB and leg weakness

## 2019-10-12 NOTE — Telephone Encounter (Signed)
Telephone call to patient. Stated had some shortness of breath yesterday with some leg weakness Took a nitroiglycerin and rested. States got better and feels fine today. Offered an appointment but pt declined.

## 2019-10-31 ENCOUNTER — Other Ambulatory Visit: Payer: Self-pay

## 2019-10-31 ENCOUNTER — Ambulatory Visit (INDEPENDENT_AMBULATORY_CARE_PROVIDER_SITE_OTHER): Payer: Medicare Other

## 2019-10-31 DIAGNOSIS — R0600 Dyspnea, unspecified: Secondary | ICD-10-CM | POA: Diagnosis not present

## 2019-10-31 NOTE — Progress Notes (Signed)
Complete echocardiogram has been performed.  Jimmy Kathi Dohn RDCS, RVT 

## 2019-11-22 ENCOUNTER — Ambulatory Visit: Payer: Medicare Other | Admitting: Neurology

## 2019-11-29 ENCOUNTER — Telehealth: Payer: Medicare Other | Admitting: Cardiology

## 2020-01-03 ENCOUNTER — Ambulatory Visit (INDEPENDENT_AMBULATORY_CARE_PROVIDER_SITE_OTHER): Payer: Medicare Other | Admitting: Cardiology

## 2020-01-03 ENCOUNTER — Encounter: Payer: Self-pay | Admitting: Cardiology

## 2020-01-03 ENCOUNTER — Other Ambulatory Visit: Payer: Self-pay

## 2020-01-03 VITALS — BP 128/80 | HR 74 | Ht 71.0 in | Wt 178.0 lb

## 2020-01-03 DIAGNOSIS — I480 Paroxysmal atrial fibrillation: Secondary | ICD-10-CM

## 2020-01-03 DIAGNOSIS — I10 Essential (primary) hypertension: Secondary | ICD-10-CM

## 2020-01-03 DIAGNOSIS — E782 Mixed hyperlipidemia: Secondary | ICD-10-CM

## 2020-01-03 DIAGNOSIS — I25118 Atherosclerotic heart disease of native coronary artery with other forms of angina pectoris: Secondary | ICD-10-CM

## 2020-01-03 DIAGNOSIS — I25709 Atherosclerosis of coronary artery bypass graft(s), unspecified, with unspecified angina pectoris: Secondary | ICD-10-CM | POA: Insufficient documentation

## 2020-01-03 HISTORY — DX: Paroxysmal atrial fibrillation: I48.0

## 2020-01-03 HISTORY — DX: Mixed hyperlipidemia: E78.2

## 2020-01-03 HISTORY — DX: Atherosclerotic heart disease of native coronary artery with other forms of angina pectoris: I25.118

## 2020-01-03 MED ORDER — ISOSORBIDE MONONITRATE ER 30 MG PO TB24: 30.0000 mg | ORAL_TABLET | Freq: Every day | ORAL | 1 refills | Status: DC

## 2020-01-03 MED ORDER — ASPIRIN EC 81 MG PO TBEC: 81.0000 mg | DELAYED_RELEASE_TABLET | Freq: Every day | ORAL | 3 refills | Status: DC

## 2020-01-03 NOTE — Progress Notes (Signed)
Cardiology Office Note:    Date:  01/03/2020   ID:  Corinna Lines, DOB [birth year], MRN [id number]  PCP:  Patient, No Pcp Per  Cardiologist:  Berniece Salines, DO  Electrophysiologist:  None   Referring MD: No ref. provider found   Follow up visit   History of Present Illness:    KHALI ALBANESE is a 59 y.o. male with a hx of persistent atrial fibrillation (on Eliquis), hypertension, diabetes not on insulin, hyperlipidemia, chronic smoker.  I saw the patient on August 30, 2019 at that time he complained of chest discomfort and due to his risk factor recommended he undergo a coronary CTA.  In the interim the patient was able to get his coronary CTA done was reported moderate coronary artery disease.  This test was already called out to the patient with results.  The patient is here today for follow-up visit.  He is under a great deal of stress he tells me that his wife just recently served him divorce papers.  Since that time he has been experiencing intermittent chest pain.  He describes having substernal chest discomfort lasting few seconds and resolves after he take nitroglycerin.  No other complaints at this time.  Past Medical History:  Diagnosis Date  . Atrial fibrillation ([place])   . Hx pulmonary embolism   . Hyperlipidemia   . Hypertension   . Parkinson's disease ([place])   . Stroke ([place])   . Thyroid disease     Past Surgical History:  Procedure Laterality Date  . BACK SURGERY    . CARDIAC CATHETERIZATION    . KNEE ARTHROSCOPY Left   . NECK SURGERY    . SPINAL CORD STIMULATOR INSERTION    . WRIST SURGERY Bilateral     Current Medications: Current Meds  Medication Sig  . albuterol (VENTOLIN HFA) 108 (90 Base) MCG/ACT inhaler Inhale 2 puffs into the lungs every 4 (four) hours as needed.  Marland Kitchen amitriptyline (ELAVIL) 25 MG tablet amitriptyline 25 mg tablet  . baclofen (LIORESAL) 10 MG tablet TK 1 T PO BID  . butalbital-acetaminophen-caffeine (FIORICET) 50-325-40 MG tablet  butalbital-acetaminophen-caffeine 50 mg-325 mg-40 mg tablet  . carbidopa-levodopa (SINEMET CR) 50-200 MG tablet TK 2 TS PO BID  . citalopram (CELEXA) 40 MG tablet TK 1 T PO QD  . dexamethasone (DECADRON) 2 MG tablet Take 6 mg by mouth daily.  Marland Kitchen DILT-XR 120 MG 24 hr capsule TK 1 C PO QD  . diphenhydrAMINE (BENADRYL) 25 mg capsule Take 25 mg by mouth at bedtime.  Marland Kitchen ELIQUIS 5 MG TABS tablet Take 5 mg by mouth 2 (two) times daily.  Marland Kitchen esomeprazole (NEXIUM) 40 MG capsule Take 40 mg by mouth daily at 12 noon.  . fenofibrate 160 MG tablet TK 1 T PO QD  . fentaNYL (DURAGESIC) 50 MCG/HR 1 patch as directed. Apply 1 patch every 2.5 days  . hydrochlorothiazide (MICROZIDE) 12.5 MG capsule TK 1 C PO QD  . JANUVIA 100 MG tablet Take 100 mg by mouth daily.  Marland Kitchen levothyroxine (SYNTHROID) 50 MCG tablet TK 1 T PO QD  . loratadine (CLARITIN) 10 MG tablet Take 10 mg by mouth daily.  Marland Kitchen lovastatin (MEVACOR) 20 MG tablet Take 1 tablet by mouth daily.  . metFORMIN (GLUCOPHAGE) 500 MG tablet TAKE 2 TABLETS PO BID  . metoCLOPramide (REGLAN) 10 MG tablet TK 1 T PO BID  . metoprolol tartrate (LOPRESSOR) 100 MG tablet TAKE 1 TABLET PO BID  . oxyCODONE-acetaminophen (PERCOCET) 10-325 MG tablet Take  1 tablet by mouth 3 (three) times daily.  . potassium chloride (K-DUR) 10 MEQ tablet TK 1 T PO QD  . pregabalin (LYRICA) 300 MG capsule TK ONE C PO Q 12 H  . promethazine (PHENERGAN) 25 MG tablet Take 25 mg by mouth every 6 (six) hours as needed for nausea or vomiting.  Marland Kitchen TRIAZOLAM PO Take 1 tablet by mouth at bedtime.     Allergies:   Aleve [naproxen sodium]   Social History   Socioeconomic History  . Marital status: Married    Spouse name: Not on file  . Number of children: Not on file  . Years of education: Not on file  . Highest education level: Not on file  Occupational History  . Not on file  Tobacco Use  . Smoking status: Current Every Day Smoker    Packs/day: 1.00    Years: 47.00    Pack years: 47.00     Types: Cigarettes  . Smokeless tobacco: Never Used  Substance and Sexual Activity  . Alcohol use: Yes    Comment: very seldom; 1 -2 times per year  . Drug use: Never  . Sexual activity: Not on file  Other Topics Concern  . Not on file  Social History Narrative  . Not on file   Social Determinants of Health   Financial Resource Strain:   . Difficulty of Paying Living Expenses: Not on file  Food Insecurity:   . Worried About Programme researcher, broadcasting/film/video in the Last Year: Not on file  . Ran Out of Food in the Last Year: Not on file  Transportation Needs:   . Lack of Transportation (Medical): Not on file  . Lack of Transportation (Non-Medical): Not on file  Physical Activity:   . Days of Exercise per Week: Not on file  . Minutes of Exercise per Session: Not on file  Stress:   . Feeling of Stress : Not on file  Social Connections:   . Frequency of Communication with Friends and Family: Not on file  . Frequency of Social Gatherings with Friends and Family: Not on file  . Attends Religious Services: Not on file  . Active Member of Clubs or Organizations: Not on file  . Attends Banker Meetings: Not on file  . Marital Status: Not on file     Family History: The patient's family history includes Emphysema in his father; Heart attack in his father; Heart disease in his father; Lung cancer in his mother.  ROS:   Review of Systems  Constitution: Negative for decreased appetite, fever and weight gain.  HENT: Negative for congestion, ear discharge, hoarse voice and sore throat.   Eyes: Negative for discharge, redness, vision loss in right eye and visual halos.  Cardiovascular: Negative for chest pain, dyspnea on exertion, leg swelling, orthopnea and palpitations.  Respiratory: Negative for cough, hemoptysis, shortness of breath and snoring.   Endocrine: Negative for heat intolerance and polyphagia.  Hematologic/Lymphatic: Negative for bleeding problem. Does not bruise/bleed  easily.  Skin: Negative for flushing, nail changes, rash and suspicious lesions.  Musculoskeletal: Negative for arthritis, joint pain, muscle cramps, myalgias, neck pain and stiffness.  Gastrointestinal: Negative for abdominal pain, bowel incontinence, diarrhea and excessive appetite.  Genitourinary: Negative for decreased libido, genital sores and incomplete emptying.  Neurological: Negative for brief paralysis, focal weakness, headaches and loss of balance.  Psychiatric/Behavioral: Negative for altered mental status, depression and suicidal ideas.  Allergic/Immunologic: Negative for HIV exposure and persistent infections.  EKGs/Labs/Other Studies Reviewed:    The following studies were reviewed today:   EKG: None today  Zio Monitor The patient wore the monitor for 7 days. Indication: Paroxysmal atrial fibrillation. The minimal HR was 59 bpm, with maximal HR of 164 bpm, and average HR of 76 bpm. Predominant underlying rhythm was Sinus Rhythm. Bundle Branch Block/IVCD was present.  QRS morphology changes were present throughout recording.  4 Supraventricular Tachycardia runs occurred, the run with the fastest interval lasting 10 beats with a max rate of 164 bpm, the longest lasting 12 beats with an average rate of 139bpm.  The supraventricular tachycardia is likely atrial tachycardia. Supraventricular Tachycardia was associated with of symptomatic patient triggered event. Premature atrial complexes were rare (<1.0%). Premature ventricular complexes were rare (<1.0%), No AV block, no pauses, no atrial fibrillation.  Transthoracic echocardiogram 10/31/2019 IMPRESSIONS  1. Left ventricular ejection fraction, by visual estimation, is 60 to 65%. The left ventricle has normal function. Left ventricular septal wall thickness was severely increased. Mildly increased left ventricular posterior wall thickness. There is  asymetrical left ventricular hypertrophy with sigmoidal basal septum  thickness.  2. Left ventricular diastolic parameters are consistent with Grade II diastolic dysfunction (pseudonormalization).  3. Global right ventricle has normal systolic function.The right ventricular size is normal. No increase in right ventricular wall thickness.  4. Left atrial size was normal.  5. Right atrial size was normal.  6. Mild mitral annular calcification.  7. The mitral valve is normal in structure. Trace mitral valve regurgitation. No evidence of mitral stenosis.  8. The tricuspid valve is normal in structure. Tricuspid valve regurgitation is not demonstrated.  9. The pulmonic valve was normal in structure. Pulmonic valve regurgitation is not visualized. 10. The aortic valve is normal in structure. Aortic valve regurgitation is not visualized. No evidence of aortic valve sclerosis or stenosis. 11. Normal pulmonary artery systolic pressure.  Coronary CTA 10/12/2019 Coronary calcium score: The patient's coronary artery calcium score is 50.35, which places the patient in the 57th percentile. Calcium noted in the LAD and LCx.  Coronary arteries: Normal coronary origins.  Left dominance.  Right Coronary Artery: Dominant vessel. No significant stenosis - mis-registration artifact in the mid-vessel.  Left Main Coronary Artery: Appears normal. Bifurcates into the LAD and LCx vessels.  Left Anterior Descending Coronary Artery: Coarses anteriorly and gives off a large high diagonal branch, neither branch reaches the apex. There is minimal 1-24% proximal mixed stenosis (CADRADS1).  Left Circumflex Artery: The AV groove LCX demonstrates no significant disease. There is a large OM1 branch with multiple sub-branches. A moderate 50-69% mixed stenosis (CADRADS3) of the proximal OM is noted.  Aorta: Normal size, 26 mm at the mid ascending aorta (level of the PA bifurcation) measured double oblique. No calcifications. No Dissection. IMPRESSION: 1. Moderate stenosis of a  large OM branch and minimal LAD disease, CADRADS = 3. CT FFR will be performed and reported separately.  2. Coronary calcium score of 50.35. This was 57th percentile for age and sex matched control.  3. Normal coronary origin with left dominance.  4. Mildly dilated main PA at 30 mm.   Recent Labs: 10/10/2019: Creatinine, Ser 1.00  Recent Lipid Panel No results found for: CHOL, TRIG, HDL, CHOLHDL, VLDL, LDLCALC, LDLDIRECT  Physical Exam:    VS:  BP 128/80   Pulse 74   Ht 5\' 11"  (1.803 m)   Wt 178 lb (80.7 kg)   SpO2 92%   BMI 24.83 kg/m     Wt  Readings from Last 3 Encounters:  01/03/20 178 lb (80.7 kg)  08/30/19 173 lb (78.5 kg)     GEN: Well nourished, well developed in no acute distress HEENT: Normal NECK: No JVD; No carotid bruits LYMPHATICS: No lymphadenopathy CARDIAC: S1S2 noted,RRR, no murmurs, rubs, gallops RESPIRATORY:  Clear to auscultation without rales, wheezing or rhonchi  ABDOMEN: Soft, non-tender, non-distended, +bowel sounds, no guarding. EXTREMITIES: No edema, No cyanosis, no clubbing MUSCULOSKELETAL:  No edema; No deformity  SKIN: Warm and dry NEUROLOGIC:  Alert and oriented x 3, non-focal PSYCHIATRIC:  Normal affect, good insight  ASSESSMENT:    1. Coronary artery disease of native artery of native heart with stable angina pectoris (HCC)   2. PAF (paroxysmal atrial fibrillation) (HCC)   3. Essential hypertension   4. Mixed hyperlipidemia    PLAN:    Given his symptoms and known coronary disease, I am going to start the patient on Imdur 30 mg daily to help with his anginal symptoms.  Also start aspirin 81 mg daily as patient has not started taking his medication.  He does have as needed nitroglycerin as well.  I did advise the patient to go to the emergency department if his chest pain does not resolve with the Imdur or as needed nitroglycerin.  In terms of his paroxysmal atrial fibrillation he will continue on his Lopressor 100 mg twice daily  as well as Eliquis 5 mg twice daily.  Hypertension-his blood pressures is acceptable in the office today we will continue him on his current medication regimen.  The patient is in agreement with the above plan. The patient left the office in stable condition.  The patient will follow up in 2 months or sooner if needed   Medication Adjustments/Labs and Tests Ordered: Current medicines are reviewed at length with the patient today.  Concerns regarding medicines are outlined above.  No orders of the defined types were placed in this encounter.  Meds ordered this encounter  Medications  . aspirin EC 81 MG tablet    Sig: Take 1 tablet (81 mg total) by mouth daily.    Dispense:  90 tablet    Refill:  3  . isosorbide mononitrate (IMDUR) 30 MG 24 hr tablet    Sig: Take 1 tablet (30 mg total) by mouth daily.    Dispense:  90 tablet    Refill:  1    Patient Instructions  Medication Instructions:  Your physician has recommended you make the following change in your medication:   START: Aspirin 81 mg Take 1 tab daily START:Imdur(isosorbide) 30 mg Take 1 tab daily  *If you need a refill on your cardiac medications before your next appointment, please call your pharmacy*  Lab Work: None If you have labs (blood work) drawn today and your tests are completely normal, you will receive your results only by: Marland Kitchen MyChart Message (if you have MyChart) OR . A paper copy in the mail If you have any lab test that is abnormal or we need to change your treatment, we will call you to review the results.  Testing/Procedures: None  Follow-Up: At [facility], you and your health needs are our priority.  As part of our continuing mission to provide you with exceptional heart care, we have created designated Provider Care Teams.  These Care Teams include your primary Cardiologist (physician) and Advanced Practice Providers (APPs -  Physician Assistants and Nurse Practitioners) who all work together to  provide you with the care you need, when  you need it.  Your next appointment:   3 month(s)  The format for your next appointment:   In Person  Provider:   Thomasene Ripple, DO  Other Instructions Isosorbide Mononitrate extended-release tablets What is this medicine? ISOSORBIDE MONONITRATE (eye soe SOR bide mon oh NYE trate) is a vasodilator. It relaxes blood vessels, increasing the blood and oxygen supply to your heart. This medicine is used to prevent chest pain caused by angina. It will not help to stop an episode of chest pain. This medicine may be used for other purposes; ask your health care provider or pharmacist if you have questions. COMMON BRAND NAME(S): Imdur, Isotrate ER What should I tell my health care provider before I take this medicine? They need to know if you have any of these conditions:  previous heart attack or heart failure  an unusual or allergic reaction to isosorbide mononitrate, nitrates, other medicines, foods, dyes, or preservatives  pregnant or trying to get pregnant  breast-feeding How should I use this medicine? Take this medicine by mouth with a glass of water. Follow the directions on the prescription label. Do not crush or chew. Take your medicine at regular intervals. Do not take your medicine more often than directed. Do not stop taking this medicine except on the advice of your doctor or health care professional. Talk to your pediatrician regarding the use of this medicine in children. Special care may be needed. Overdosage: If you think you have taken too much of this medicine contact a poison control center or emergency room at once. NOTE: This medicine is only for you. Do not share this medicine with others. What if I miss a dose? If you miss a dose, take it as soon as you can. If it is almost time for your next dose, take only that dose. Do not take double or extra doses. What may interact with this medicine? Do not take this medicine with any of  the following medications:  medicines used to treat erectile dysfunction (ED) like avanafil, sildenafil, tadalafil, and vardenafil  riociguat This medicine may also interact with the following medications:  medicines for high blood pressure  other medicines for angina or heart failure This list may not describe all possible interactions. Give your health care provider a list of all the medicines, herbs, non-prescription drugs, or dietary supplements you use. Also tell them if you smoke, drink alcohol, or use illegal drugs. Some items may interact with your medicine. What should I watch for while using this medicine? Check your heart rate and blood pressure regularly while you are taking this medicine. Ask your doctor or health care professional what your heart rate and blood pressure should be and when you should contact him or her. Tell your doctor or health care professional if you feel your medicine is no longer working. You may get dizzy. Do not drive, use machinery, or do anything that needs mental alertness until you know how this medicine affects you. To reduce the risk of dizzy or fainting spells, do not sit or stand up quickly, especially if you are an older patient. Alcohol can make you more dizzy, and increase flushing and rapid heartbeats. Avoid alcoholic drinks. Do not treat yourself for coughs, colds, or pain while you are taking this medicine without asking your doctor or health care professional for advice. Some ingredients may increase your blood pressure. What side effects may I notice from receiving this medicine? Side effects that you should report to your doctor or  health care professional as soon as possible:  bluish discoloration of lips, fingernails, or palms of hands  irregular heartbeat, palpitations  low blood pressure  nausea, vomiting  persistent headache  unusually weak or tired Side effects that usually do not require medical attention (report to your  doctor or health care professional if they continue or are bothersome):  flushing of the face or neck  rash This list may not describe all possible side effects. Call your doctor for medical advice about side effects. You may report side effects to FDA at 1-800-FDA-1088. Where should I keep my medicine? Keep out of the reach of children. Store between 15 and 30 degrees C (59 and 86 degrees F). Keep container tightly closed. Throw away any unused medicine after the expiration date. NOTE: This sheet is a summary. It may not cover all possible information. If you have questions about this medicine, talk to your doctor, pharmacist, or health care provider.  2020 Elsevier/Gold Standard (2013-09-30 14:48:19)      Adopting a Healthy Lifestyle.  Know what a healthy weight is for you (roughly BMI <25) and aim to maintain this   Aim for 7+ servings of fruits and vegetables daily   65-80+ fluid ounces of water or unsweet tea for healthy kidneys   Limit to max 1 drink of alcohol per day; avoid smoking/tobacco   Limit animal fats in diet for cholesterol and heart health - choose grass fed whenever available   Avoid highly processed foods, and foods high in saturated/trans fats   Aim for low stress - take time to unwind and care for your mental health   Aim for 150 min of moderate intensity exercise weekly for heart health, and weights twice weekly for bone health   Aim for 7-9 hours of sleep daily   When it comes to diets, agreement about the perfect plan isnt easy to find, even among the experts. Experts at the [employer] developed an idea known as the Healthy Eating Plate. Just imagine a plate divided into logical, healthy portions.   The emphasis is on diet quality:   Load up on vegetables and fruits - one-half of your plate: Aim for color and variety, and remember that potatoes dont count.   Go for whole grains - one-quarter of your plate: Whole wheat, barley,  wheat berries, quinoa, oats, brown rice, and foods made with them. If you want pasta, go with whole wheat pasta.   Protein power - one-quarter of your plate: Fish, chicken, beans, and nuts are all healthy, versatile protein sources. Limit red meat.   The diet, however, does go beyond the plate, offering a few other suggestions.   Use healthy plant oils, such as olive, canola, soy, corn, sunflower and peanut. Check the labels, and avoid partially hydrogenated oil, which have unhealthy trans fats.   If youre thirsty, drink water. Coffee and tea are good in moderation, but skip sugary drinks and limit milk and dairy products to one or two daily servings.   The type of carbohydrate in the diet is more important than the amount. Some sources of carbohydrates, such as vegetables, fruits, whole grains, and beans-are healthier than others.   Finally, stay active  Signed, Thomasene RippleKardie Imagine Nest, DO  01/03/2020 2:36 PM    [facility]

## 2020-01-03 NOTE — Patient Instructions (Signed)
Medication Instructions:  Your physician has recommended you make the following change in your medication:   START: Aspirin 81 mg Take 1 tab daily START:Imdur(isosorbide) 30 mg Take 1 tab daily  *If you need a refill on your cardiac medications before your next appointment, please call your pharmacy*  Lab Work: None If you have labs (blood work) drawn today and your tests are completely normal, you will receive your results only by: Marland Kitchen MyChart Message (if you have MyChart) OR . A paper copy in the mail If you have any lab test that is abnormal or we need to change your treatment, we will call you to review the results.  Testing/Procedures: None  Follow-Up: At [facility], you and your health needs are our priority.  As part of our continuing mission to provide you with exceptional heart care, we have created designated Provider Care Teams.  These Care Teams include your primary Cardiologist (physician) and Advanced Practice Providers (APPs -  Physician Assistants and Nurse Practitioners) who all work together to provide you with the care you need, when you need it.  Your next appointment:   3 month(s)  The format for your next appointment:   In Person  Provider:   Thomasene Ripple, DO  Other Instructions Isosorbide Mononitrate extended-release tablets What is this medicine? ISOSORBIDE MONONITRATE (eye soe SOR bide mon oh NYE trate) is a vasodilator. It relaxes blood vessels, increasing the blood and oxygen supply to your heart. This medicine is used to prevent chest pain caused by angina. It will not help to stop an episode of chest pain. This medicine may be used for other purposes; ask your health care provider or pharmacist if you have questions. COMMON BRAND NAME(S): Imdur, Isotrate ER What should I tell my health care provider before I take this medicine? They need to know if you have any of these conditions:  previous heart attack or heart failure  an unusual or allergic  reaction to isosorbide mononitrate, nitrates, other medicines, foods, dyes, or preservatives  pregnant or trying to get pregnant  breast-feeding How should I use this medicine? Take this medicine by mouth with a glass of water. Follow the directions on the prescription label. Do not crush or chew. Take your medicine at regular intervals. Do not take your medicine more often than directed. Do not stop taking this medicine except on the advice of your doctor or health care professional. Talk to your pediatrician regarding the use of this medicine in children. Special care may be needed. Overdosage: If you think you have taken too much of this medicine contact a poison control center or emergency room at once. NOTE: This medicine is only for you. Do not share this medicine with others. What if I miss a dose? If you miss a dose, take it as soon as you can. If it is almost time for your next dose, take only that dose. Do not take double or extra doses. What may interact with this medicine? Do not take this medicine with any of the following medications:  medicines used to treat erectile dysfunction (ED) like avanafil, sildenafil, tadalafil, and vardenafil  riociguat This medicine may also interact with the following medications:  medicines for high blood pressure  other medicines for angina or heart failure This list may not describe all possible interactions. Give your health care provider a list of all the medicines, herbs, non-prescription drugs, or dietary supplements you use. Also tell them if you smoke, drink alcohol, or use illegal  drugs. Some items may interact with your medicine. What should I watch for while using this medicine? Check your heart rate and blood pressure regularly while you are taking this medicine. Ask your doctor or health care professional what your heart rate and blood pressure should be and when you should contact him or her. Tell your doctor or health care  professional if you feel your medicine is no longer working. You may get dizzy. Do not drive, use machinery, or do anything that needs mental alertness until you know how this medicine affects you. To reduce the risk of dizzy or fainting spells, do not sit or stand up quickly, especially if you are an older patient. Alcohol can make you more dizzy, and increase flushing and rapid heartbeats. Avoid alcoholic drinks. Do not treat yourself for coughs, colds, or pain while you are taking this medicine without asking your doctor or health care professional for advice. Some ingredients may increase your blood pressure. What side effects may I notice from receiving this medicine? Side effects that you should report to your doctor or health care professional as soon as possible:  bluish discoloration of lips, fingernails, or palms of hands  irregular heartbeat, palpitations  low blood pressure  nausea, vomiting  persistent headache  unusually weak or tired Side effects that usually do not require medical attention (report to your doctor or health care professional if they continue or are bothersome):  flushing of the face or neck  rash This list may not describe all possible side effects. Call your doctor for medical advice about side effects. You may report side effects to FDA at 1-800-FDA-1088. Where should I keep my medicine? Keep out of the reach of children. Store between 15 and 30 degrees C (59 and 86 degrees F). Keep container tightly closed. Throw away any unused medicine after the expiration date. NOTE: This sheet is a summary. It may not cover all possible information. If you have questions about this medicine, talk to your doctor, pharmacist, or health care provider.  2020 Elsevier/Gold Standard (2013-09-30 14:48:19)

## 2020-01-05 ENCOUNTER — Encounter: Payer: Self-pay | Admitting: Neurology

## 2020-01-05 ENCOUNTER — Ambulatory Visit: Payer: Medicare Other | Admitting: Neurology

## 2020-01-05 ENCOUNTER — Other Ambulatory Visit: Payer: Self-pay

## 2020-01-05 VITALS — BP 122/67 | HR 91 | Temp 97.3°F | Ht 69.0 in | Wt 176.3 lb

## 2020-01-05 DIAGNOSIS — G20 Parkinson's disease: Secondary | ICD-10-CM | POA: Diagnosis not present

## 2020-01-05 NOTE — Progress Notes (Signed)
Subjective:    Patient ID: SEVEN DOLLENS is a 59 y.o. male.  HPI     Huston Foley, MD, PhD [facility] [address] [address] [place], [place] [address]  Dear Dr. Santa Lighter,   I saw your patient, Charles Huynh, upon your kind request in my neurologic clinic today for initial consultation of his parkinsonism or Parkinson's disease. The patient is unaccompanied today.  Of note, he missed an appointment on 11/22/2019.  As you know, Charles Huynh is a 59 year old right-handed gentleman with an underlying medical history of stroke, hypertension, hyperlipidemia, A. fib, history of pulmonary embolism, thyroid disease and chronic pain, on chronic narcotic pain medication, and smoking who was diagnosed with Parkinson's disease in or around 2010.  He started having right hand more than left hand tremor about 10 or 11 years ago.  He is not sure about the exact dates.  He believes that Sinemet CR is the only medication he has ever tried.  He is currently taking 50-200 mg strength 2 pills twice daily.  He is to follow with Dr. Maryella Shivers out of [facility] in [place].  We will request office records from his neurologist. He has occasional constipation, he uses a cane sometimes for gait stability.  He reports that he was treated for COVID-19 about 6 or 7 weeks ago including oral steroids.  He continues to smoke, he is trying to quit smoking eventually.  He drinks caffeine in the form of coffee, 1 cup in the morning and 4 to 5 cans of diet soda per day.  He believes that his father may have had Parkinson's disease or a tremor.  His paternal grandmother may have had Parkinson's disease as well.  The patient is currently separated from his wife who resides in [place], [place], he moved about a year ago to live with his son.  Recently, his ex-wife, his son's mother also moved in, he endorses stress.  The patient does not work.  He has not established care with a new primary  care physician yet.  He also needs to establish with a pain specialist he indicates.  He has a history of multiple injuries and surgeries. I reviewed your office note from 08/04/2019, which you kindly included. He had blood work through your office on 07/07/2019 and I reviewed the results: CMP showed unremarkable findings, with the exception of a blood sugar of 301, creatinine was 0.8, BUN was 16.  CBC with differential was unremarkable, A1c was elevated at 8.8. He reports that his most recent A1c was higher, around 10. Of note, he is on multiple medications including potentially sedating medications.  He is on fentanyl 50 mg patch, pregabalin 300 mg twice daily, Celexa 40 mg daily, triazolam 0.25 mg at bedtime.  He is also on baclofen 10 mg twice daily.  He is Sinemet ER 50-200 mg strength 2 tablets twice daily.  His Past Medical History Is Significant For: Past Medical History:  Diagnosis Date  . Anxiety   . Atrial fibrillation (HCC)   . Depression   . Diabetes (HCC)   . Heart disease   . Hx pulmonary embolism   . Hyperlipidemia   . Hypertension   . Parkinson's disease (HCC)   . Stroke (HCC)   . Thyroid disease     His Past Surgical History Is Significant For: Past Surgical History:  Procedure Laterality Date  . BACK SURGERY    . CARDIAC CATHETERIZATION    . KNEE ARTHROSCOPY  Left   . NECK SURGERY    . SPINAL CORD STIMULATOR INSERTION    . WRIST SURGERY Bilateral     His Family History Is Significant For: Family History  Problem Relation Age of Onset  . Lung cancer Mother   . Heart attack Father        At age 8.  . Emphysema Father   . Heart disease Father     His Social History Is Significant For: Social History   Socioeconomic History  . Marital status: Married    Spouse name: Not on file  . Number of children: Not on file  . Years of education: Not on file  . Highest education level: Not on file  Occupational History  . Not on file  Tobacco Use  . Smoking  status: Current Every Day Smoker    Packs/day: 1.00    Years: 47.00    Pack years: 47.00    Types: Cigarettes  . Smokeless tobacco: Never Used  Substance and Sexual Activity  . Alcohol use: Yes    Comment: very seldom; 1 -2 times per year  . Drug use: Never  . Sexual activity: Not on file  Other Topics Concern  . Not on file  Social History Narrative  . Not on file   Social Determinants of Health   Financial Resource Strain:   . Difficulty of Paying Living Expenses: Not on file  Food Insecurity:   . Worried About Charity fundraiser in the Last Year: Not on file  . Ran Out of Food in the Last Year: Not on file  Transportation Needs:   . Lack of Transportation (Medical): Not on file  . Lack of Transportation (Non-Medical): Not on file  Physical Activity:   . Days of Exercise per Week: Not on file  . Minutes of Exercise per Session: Not on file  Stress:   . Feeling of Stress : Not on file  Social Connections:   . Frequency of Communication with Friends and Family: Not on file  . Frequency of Social Gatherings with Friends and Family: Not on file  . Attends Religious Services: Not on file  . Active Member of Clubs or Organizations: Not on file  . Attends Archivist Meetings: Not on file  . Marital Status: Not on file    His Allergies Are:  Allergies  Allergen Reactions  . Aleve [Naproxen Sodium] Shortness Of Breath  :   His Current Medications Are:  Outpatient Encounter Medications as of 01/05/2020  Medication Sig  . albuterol (VENTOLIN HFA) 108 (90 Base) MCG/ACT inhaler Inhale 2 puffs into the lungs every 4 (four) hours as needed.  Marland Kitchen aspirin EC 81 MG tablet Take 1 tablet (81 mg total) by mouth daily.  . baclofen (LIORESAL) 10 MG tablet TK 1 T PO BID  . carbidopa-levodopa (SINEMET CR) 50-200 MG tablet TK 2 TS PO BID  . citalopram (CELEXA) 40 MG tablet TK 1 T PO QD  . DILT-XR 120 MG 24 hr capsule TK 1 C PO QD  . diphenhydrAMINE (BENADRYL) 25 mg capsule Take  25 mg by mouth at bedtime.  Marland Kitchen ELIQUIS 5 MG TABS tablet Take 5 mg by mouth 2 (two) times daily.  Marland Kitchen esomeprazole (NEXIUM) 40 MG capsule Take 40 mg by mouth daily at 12 noon.  . fenofibrate 160 MG tablet TK 1 T PO QD  . fentaNYL (DURAGESIC) 50 MCG/HR 1 patch as directed. Apply 1 patch every 2.5 days  . hydrochlorothiazide (  MICROZIDE) 12.5 MG capsule TK 1 C PO QD  . isosorbide mononitrate (IMDUR) 30 MG 24 hr tablet Take 1 tablet (30 mg total) by mouth daily.  Marland Kitchen JANUVIA 100 MG tablet Take 100 mg by mouth daily.  Marland Kitchen levothyroxine (SYNTHROID) 50 MCG tablet TK 1 T PO QD  . lovastatin (MEVACOR) 20 MG tablet Take 1 tablet by mouth daily.  . metFORMIN (GLUCOPHAGE) 500 MG tablet TAKE 2 TABLETS PO BID  . metoCLOPramide (REGLAN) 10 MG tablet TK 1 T PO BID  . metoprolol tartrate (LOPRESSOR) 100 MG tablet TAKE 1 TABLET PO BID  . oxyCODONE-acetaminophen (PERCOCET) 10-325 MG tablet Take 1 tablet by mouth 3 (three) times daily.  . potassium chloride (K-DUR) 10 MEQ tablet TK 1 T PO QD  . pregabalin (LYRICA) 300 MG capsule TK ONE C PO Q 12 H  . promethazine (PHENERGAN) 25 MG tablet Take 25 mg by mouth every 6 (six) hours as needed for nausea or vomiting.  Marland Kitchen TRIAZOLAM PO Take 1 tablet by mouth at bedtime.  . nitroGLYCERIN (NITROSTAT) 0.4 MG SL tablet Place 1 tablet (0.4 mg total) under the tongue every 5 (five) minutes as needed for chest pain.  . [DISCONTINUED] amitriptyline (ELAVIL) 25 MG tablet amitriptyline 25 mg tablet  . [DISCONTINUED] butalbital-acetaminophen-caffeine (FIORICET) 50-325-40 MG tablet butalbital-acetaminophen-caffeine 50 mg-325 mg-40 mg tablet  . [DISCONTINUED] dexamethasone (DECADRON) 2 MG tablet Take 6 mg by mouth daily.  . [DISCONTINUED] loratadine (CLARITIN) 10 MG tablet Take 10 mg by mouth daily.   No facility-administered encounter medications on file as of 01/05/2020.  :   Review of Systems:  Out of a complete 14 point review of systems, all are reviewed and negative with the  exception of these symptoms as listed below:     Review of Systems  Neurological:       Moved from [facility] needs a primary neurologist for Parkinson's care.    Objective:  Neurological Exam  Physical Exam Physical Examination:   Vitals:   01/05/20 1547  BP: 122/67  Pulse: 91  Temp: (!) 97.3 F (36.3 C)   General Examination: The patient is a very pleasant 59 y.o. male in no acute distress. He appears Deconditioned.  He is adequately groomed.   HEENT: Normocephalic, atraumatic, pupils are equal, round and reactive to light,Extraocular tracking is moderately impaired, he has mild to moderate facial masking, he has moderate nuchal rigidity.  Facial sensation is intact, he has mild hypophonia, minimal dysarthria, airway examination reveals mild mouth dryness, tongue protrudes centrally and palate elevates symmetrically.  No carotid bruits. Edentulous on top.  Chest: Clear to auscultation without wheezing, rhonchi or crackles noted.  Heart: S1+S2+0, regular and normal without murmurs, rubs or gallops noted.   Abdomen: Soft, non-tender and non-distended with normal bowel sounds appreciated on auscultation.  Extremities: There is no pitting edema in the distal lower extremities bilaterally. Pedal pulses are intact.  Skin: Warm and dry without trophic changes noted.  Musculoskeletal: exam reveals no obvious joint deformities, tenderness or joint swelling or erythema.   Neurologically:  Mental status: The patient is awake, alert, But has difficulty relating his history, he has difficulty remembering exact dates and chronology of events.  He reports that he has had some memory loss with his Parkinson's. Speech shows hypophonia and mild dysarthria.  He has no obvious sialorrhea.  Cranial nerves are as described above under HEENT exam.  Motor exam reveals a moderate degree of resting tremor in the right upper and to a slightly  lesser degree on the left upper extremity but it is constant.  He  has an intermittent resting tremor in the right foot.  On fine motor testing, he has moderate difficulty on the right, slightly better on the left.  He stands up with difficulty and pushes himself up, posture is moderately stooped for age.  He walks with decreased stride length and decreased pace, no obvious shuffling, he has decreased Arm swing bilaterally.  He has mildly impaired balance especially with turns.  He did not bring a walker or cane. Reflexes are 1+ throughout.  Sensory exam is intact to light touch.  Cerebellar testing showed no obvious dysmetria or intention tremor.  Assessment and Plan:   Assessment and Plan:  In summary, Charles Huynh is a very pleasant 59 y.o.-year old male with an underlying medical history of stroke, hypertension, hyperlipidemia, A. fib, history of pulmonary embolism, thyroid disease and chronic pain, on chronic narcotic pain medication, and smoking who Presents for evaluation of his Parkinson's disease.  His history and examination are supportive of right-sided predominant Parkinson's disease.  He reports that he was diagnosed in or around 2010 And that he has been on Sinemet CR since then.  We will try to get His records from his previous neurologist, Dr. Maryella Shivers from [place].  He is willing to sign a release of information form today.  He is advised to continue with his Sinemet CR.  I would recommend that he take 1 pill 4 times a day instead of 2 pills twice daily.  To that end, he is advised to take it at 9 AM, 1 PM, 5 PM and 9 PM.  He is up-to-date with his prescription for now.  He is advised to call if he needs a refill.  He is advised to stay better hydrated with water and try to limit his caffeine intake.  He is encouraged to start using his walking stick or walking cane consistently for gait safety. He is in the process of establishing with a new primary care physician locally as well as with a pain specialist.  He is also in the process of seeing a  dentist.We talked about the importance of fall prevention and being proactive about constipation. He reports memory loss associated with his Parkinson's disease.  We will monitor. He is advised to follow-up routinely In 4 months, sooner if needed.  I answered all his questions today and he was in agreement. Thank you very much for allowing me to participate in the care of this nice patient. If I can be of any further assistance to you please do not hesitate to call me at [phone].  Sincerely,   Huston Foley, MD, PhD

## 2020-01-05 NOTE — Patient Instructions (Addendum)
For your Parkinson's disease, you can continue with your current medication, Sinemet CR 50-200 mg strength, but I recommend that you take 1 pill 4 times a day, at 9 AM, 1 PM, 5 PM and 9 PM daily.  You recently received a updated prescription from your primary care physician in [place].  Please follow-up routinely in about 4 months. Please try to hydrate better with water, 6 to 8 cups of water are recommended daily, 8 ounces each.  Please try to reduce your caffeine intake as it can exacerbate tremors.  Try to limit yourself to 2 servings of caffeine including coffee and soda per day.  Please be proactive about constipation issues.Use your cane for gait safety. We will try to get records from your previous neurologist, Dr. Maryella Shivers in [place].

## 2020-02-28 ENCOUNTER — Other Ambulatory Visit: Payer: Self-pay | Admitting: Cardiology

## 2020-03-05 ENCOUNTER — Telehealth: Payer: Self-pay | Admitting: Neurology

## 2020-03-05 NOTE — Telephone Encounter (Signed)
I called pt. He is seeing things that aren't there and accusing his family of things that they didn't do. He is wondering if this could be related to PD. I advised him that hallucinations are sometimes a symptom of PD. I recommended a f/u and pt agreed to an appt on 3/23/21at 2:00pm, check in at 1:30pm. Pt verbalized understanding of new appt date and time.

## 2020-03-05 NOTE — Telephone Encounter (Signed)
Pt called stating that he thinks everyone is against him and his son thinks he's crazy. He states that he see's things that are not there and he is wanting to speak to RN because he does not know if this has to do with his Parkinson's and some sort of side effect or if it can possibly be something else. Pt is desperately wanting to speak to someone. Please advise

## 2020-03-06 ENCOUNTER — Encounter: Payer: Self-pay | Admitting: Neurology

## 2020-03-06 ENCOUNTER — Other Ambulatory Visit: Payer: Self-pay

## 2020-03-06 ENCOUNTER — Ambulatory Visit: Payer: Medicare Other | Admitting: Neurology

## 2020-03-06 VITALS — BP 119/71 | HR 78 | Temp 97.5°F | Ht 70.0 in | Wt 171.0 lb

## 2020-03-06 DIAGNOSIS — G20 Parkinson's disease: Secondary | ICD-10-CM | POA: Diagnosis not present

## 2020-03-06 DIAGNOSIS — F22 Delusional disorders: Secondary | ICD-10-CM | POA: Diagnosis not present

## 2020-03-06 NOTE — Patient Instructions (Addendum)
I am not sure if you are having delusions from having Parkinson's disease or parkinsonism.  My bigger concern is your medications, which, in combination, can cause cognitive issues, including sluggishness, confusion, sleepiness, hallucinations and delusions.  You are on 2 different narcotic pain medications, in addition, you take Lyrica, a muscle relaxer called baclofen, and you take a benzodiazepine which in combination with a narcotic pain medication or narcotic pain medications is considered high risk.  Please talk to your primary care physician as well as pain management physician about your medication regimen.  Long term use of Reglan can cause parkinsonism.  At this juncture, I do not feel comfortable adding yet another psychotropic medication for your delusions.  If these persist, you may benefit from seeing a psychiatrist.  Please try to hydrate better with water and drink about 3-4 bottles of water per day as you may not be hydrating well enough.  I would recommend that you reduce your diet soda intake and limit yourself to 2 or 3 servings per day rather than up to 12.  Please continue with your Sinemet CR 1 pill 4 times a day.  Please start using your cane consistently for gait stability.  Follow-up with the nurse practitioner as scheduled in May.  Please stop taking benadryl.  You can try Melatonin at night for sleep: take 1 mg to 3 mg, one to 2 hours before your bedtime. You can go up to 5 mg if needed. It is over the counter and comes in pill form, chewable form and spray, if you prefer.

## 2020-03-06 NOTE — Progress Notes (Signed)
Subjective:    Patient ID: Charles Huynh is a 59 y.o. male.  HPI    Interim history:   Mr. Charles Huynh is a 59 year old right-handed gentleman with an underlying medical history of stroke, hypertension, hyperlipidemia, A. fib, history of pulmonary embolism, thyroid disease and chronic pain, on chronic narcotic pain medication, and smoking who presents for follow-up consultation of his parkinsonism.  The patient is accompanied by his ex-wife, Star, today.  I first met him on 01/05/2020, at which time he reported a diagnosis of Parkinson's disease some 10 or 11 years ago.  He was on Sinemet CR 50-200 mg strength 2 pills twice daily.  He used to follow with a neurologist out of [facility].  We requested records from his neurologist. He was advised to take Sinemet CR 1 pill qid. He called for a sooner than scheduled appointment due to hallucinations and possible delusions.   Today, 03/06/2020: He reports not actually having any hallucinations but he has delusions of his family turning against him and that his son is taking his medications from them.  His ex-wife administers his meds.  He is on multiple high risk medications, he continues to see his primary care physician in [place], his PCP is prescribing the baclofen and the triazolam.  He also follows with a pain management doctor in [place] and is on high-dose Lyrica, oxycodone which was increased to 4 times a day, and fentanyl patch.  He has fallen twice recently but reports that he did not injure himself.  He did not seek medical attention.  He drinks about 1 or 2 bottles of water per day, likes to drink diet decaf soda, up to 12 cans/day.  He drinks coffee in the morning, 1 or 2 cups, no alcohol, continues to smoke 1 pack/day.  He takes his Sinemet 1 pill 4 times a day at 11, 3 PM, 7 PM and 11 PM.  He has not had any new motor symptoms.  He also takes Benadryl at night for sleep as needed.  The patient's allergies, current medications, family  history, past medical history, past social history, past surgical history and problem list were reviewed and updated as appropriate.   Previously:   01/05/20: (He) was diagnosed with Parkinson's disease in or around 2010.  He started having right hand more than left hand tremor about 10 or 11 years ago.  He is not sure about the exact dates.  He believes that Sinemet CR is the only medication he has ever tried.  He is currently taking 50-200 mg strength 2 pills twice daily.  He is to follow with Dr. Charlynne Pander out of [facility] in [place].  We will request office records from his neurologist. He has occasional constipation, he uses a cane sometimes for gait stability.  He reports that he was treated for COVID-19 about 6 or 7 weeks ago including oral steroids.  He continues to smoke, he is trying to quit smoking eventually.  He drinks caffeine in the form of coffee, 1 cup in the morning and 4 to 5 cans of diet soda per day.  He believes that his father may have had Parkinson's disease or a tremor.  His paternal grandmother may have had Parkinson's disease as well.  The patient is currently separated from his wife who resides in [place], [place], he moved about a year ago to live with his son.  Recently, his ex-wife, his son's mother also moved in, he endorses stress.  The  patient does not work.  He has not established care with a new primary care physician yet.  He also needs to establish with a pain specialist he indicates.  He has a history of multiple injuries and surgeries. I reviewed your office note from 08/04/2019, which you kindly included. He had blood work through your office on 07/07/2019 and I reviewed the results: CMP showed unremarkable findings, with the exception of a blood sugar of 301, creatinine was 0.8, BUN was 16.  CBC with differential was unremarkable, A1c was elevated at 8.8. He reports that his most recent A1c was higher, around 10. Of note, he is on multiple  medications including potentially sedating medications.  He is on fentanyl 50 mg patch, pregabalin 300 mg twice daily, Celexa 40 mg daily, triazolam 0.25 mg at bedtime.  He is also on baclofen 10 mg twice daily.  He is Sinemet ER 50-200 mg strength 2 tablets twice daily.  His Past Medical History Is Significant For: Past Medical History:  Diagnosis Date  . Anxiety   . Atrial fibrillation ([place])   . Depression   . Diabetes ([place])   . Heart disease   . Hx pulmonary embolism   . Hyperlipidemia   . Hypertension   . Parkinson's disease ([place])   . Stroke ([place])   . Thyroid disease     His Past Surgical History Is Significant For: Past Surgical History:  Procedure Laterality Date  . BACK SURGERY    . CARDIAC CATHETERIZATION    . KNEE ARTHROSCOPY Left   . NECK SURGERY    . SPINAL CORD STIMULATOR INSERTION    . WRIST SURGERY Bilateral     His Family History Is Significant For: Family History  Problem Relation Age of Onset  . Lung cancer Mother   . Heart attack Father        At age 23.  . Emphysema Father   . Heart disease Father     His Social History Is Significant For: Social History   Socioeconomic History  . Marital status: Married    Spouse name: Not on file  . Number of children: Not on file  . Years of education: Not on file  . Highest education level: Not on file  Occupational History  . Not on file  Tobacco Use  . Smoking status: Current Every Day Smoker    Packs/day: 1.00    Years: 47.00    Pack years: 47.00    Types: Cigarettes  . Smokeless tobacco: Never Used  Substance and Sexual Activity  . Alcohol use: Yes    Comment: very seldom; 1 -2 times per year  . Drug use: Never  . Sexual activity: Not on file  Other Topics Concern  . Not on file  Social History Narrative  . Not on file   Social Determinants of Health   Financial Resource Strain:   . Difficulty of Paying Living Expenses:   Food Insecurity:   . Worried About Charity fundraiser in the  Last Year:   . Arboriculturist in the Last Year:   Transportation Needs:   . Film/video editor (Medical):   Marland Kitchen Lack of Transportation (Non-Medical):   Physical Activity:   . Days of Exercise per Week:   . Minutes of Exercise per Session:   Stress:   . Feeling of Stress :   Social Connections:   . Frequency of Communication with Friends and Family:   . Frequency of Social Gatherings with  Friends and Family:   . Attends Religious Services:   . Active Member of Clubs or Organizations:   . Attends Archivist Meetings:   Marland Kitchen Marital Status:     His Allergies Are:  Allergies  Allergen Reactions  . Aleve [Naproxen Sodium] Shortness Of Breath  :   His Current Medications Are:  Outpatient Encounter Medications as of 03/06/2020  Medication Sig  . albuterol (VENTOLIN HFA) 108 (90 Base) MCG/ACT inhaler Inhale 2 puffs into the lungs every 4 (four) hours as needed.  Marland Kitchen aspirin EC 81 MG tablet Take 1 tablet (81 mg total) by mouth daily.  . baclofen (LIORESAL) 10 MG tablet TK 1 T PO BID  . carbidopa-levodopa (SINEMET CR) 50-200 MG tablet TK 2 TS PO BID  . citalopram (CELEXA) 40 MG tablet TK 1 T PO QD  . DILT-XR 120 MG 24 hr capsule TK 1 C PO QD  . diphenhydrAMINE (BENADRYL) 25 mg capsule Take 25 mg by mouth at bedtime.  Marland Kitchen ELIQUIS 5 MG TABS tablet Take 5 mg by mouth 2 (two) times daily.  Marland Kitchen esomeprazole (NEXIUM) 40 MG capsule Take 40 mg by mouth daily at 12 noon.  . fenofibrate 160 MG tablet TK 1 T PO QD  . fentaNYL (DURAGESIC) 50 MCG/HR 1 patch as directed. Apply 1 patch every 2.5 days  . hydrochlorothiazide (MICROZIDE) 12.5 MG capsule TK 1 C PO QD  . isosorbide mononitrate (IMDUR) 30 MG 24 hr tablet Take 1 tablet (30 mg total) by mouth daily.  Marland Kitchen JANUVIA 100 MG tablet Take 100 mg by mouth daily.  Marland Kitchen levothyroxine (SYNTHROID) 50 MCG tablet TK 1 T PO QD  . lovastatin (MEVACOR) 20 MG tablet Take 1 tablet by mouth daily.  . metFORMIN (GLUCOPHAGE) 500 MG tablet TAKE 2 TABLETS PO BID   . metoCLOPramide (REGLAN) 10 MG tablet TK 1 T PO BID  . metoprolol tartrate (LOPRESSOR) 100 MG tablet TAKE 1 TABLET PO BID  . nitroGLYCERIN (NITROSTAT) 0.4 MG SL tablet PLACE 1 TABLET UNDER THE TONGUE EVERY 5 MINUTES AS NEEDED FOR CHEST PAIN  . oxyCODONE-acetaminophen (PERCOCET) 10-325 MG tablet Take 1 tablet by mouth 4 (four) times daily.   . potassium chloride (K-DUR) 10 MEQ tablet TK 1 T PO QD  . pregabalin (LYRICA) 300 MG capsule TK ONE C PO Q 12 H  . promethazine (PHENERGAN) 25 MG tablet Take 25 mg by mouth every 6 (six) hours as needed for nausea or vomiting.  Marland Kitchen TRIAZOLAM PO Take 1 tablet by mouth at bedtime.   No facility-administered encounter medications on file as of 03/06/2020.  :  Review of Systems:  Out of a complete 14 point review of systems, all are reviewed and negative with the exception of these symptoms as listed below: Review of Systems  Neurological:       Pt presents today to discuss his delusions. Pt has been accusing family of doing things they are not. Pt has accused his son of stealing medications.     Objective:  Neurological Exam  Physical Exam Physical Examination:   Vitals:   03/06/20 1402  BP: 119/71  Pulse: 78  Temp: (!) 97.5 F (36.4 C)   General Examination: The patient is a very pleasant 59 y.o. male in no acute distress.  HEENT: Normocephalic, atraumatic, pupils are equal, round and reactive to light, extraocular tracking is mild to moderately impaired, he has mild to moderate facial masking, he has moderate nuchal rigidity.  Facial sensation is intact, he  has mild hypophonia, minimal dysarthria, airway examination reveals severe mouth dryness, tongue protrudes centrally and palate elevates symmetrically.  No carotid bruits. Edentulous on top, very few teeth.  Chest: Clear to auscultation without wheezing, rhonchi or crackles noted.  Heart: S1+S2+0, regular and normal without murmurs, rubs or gallops noted.   Abdomen: Soft, non-tender  and non-distended with normal bowel sounds appreciated on auscultation.  Extremities: There is no pitting edema in the distal lower extremities bilaterally.   Skin: Warm and dry without trophic changes noted.  Musculoskeletal: exam reveals R arm pain. Reports diffuse and generalized pain.   Neurologically:  Mental status: The patient is awake, alert, but has difficulty relating his history, he has difficulty remembering exact dates and chronology of events.  He reports that he has had some memory loss. His ex-wife supplements the Hx. Speech shows hypophonia and mild dysarthria.  He has no obvious sialorrhea.  Cranial nerves are as described above under HEENT exam.  Motor exam reveals a mild resting tremor in the right upper and to a slightly lesser degree on the left upper extremity. No other resting tremor.  On fine motor testing, he has moderate difficulty on the right, slightly better on the left.  He stands up with difficulty and pushes himself up, posture is moderately stooped for age.  He walks with decreased stride length and decreased pace, no obvious shuffling, he has decreased Arm swing bilaterally.  He has mildly impaired balance especially with turns.  He did not bring a walker or cane. Reflexes are 1+ throughout.  Sensory exam is intact to light touch. Cerebellar testing showed no obvious dysmetria or intention tremor.  Assessment and Plan:   In summary, TYRUS WILMS is a 59 year old male with an underlying complex medical history of stroke, hypertension, hyperlipidemia, A. fib, history of pulmonary embolism, thyroid disease and chronic pain, on chronic narcotic pain medication, and smoking who presents for FU consultation of his Parkinsonism. He reports that he was diagnosed in or around 2010 and that he has been on Sinemet CR since then.  I requested records from his prior neurologist in [place], I have not received records to review.  He was advised during our first visit  in January 2021 that he should not double up his Sinemet but take 1 pill 4 times a day.  He has done so and feels it has helped.  He denies any hallucinations but does report feelings of distrust and delusions.  I am worried about his medications causing him the side effects.  I would not recommend any additional psychotropic medication to treat delusions from my end of things.  In essence, I would like for him to discuss his medication regimen with his primary care physician as well as his pain management physician.  His oxycodone was increased to 4 times a day.  He is on 2 different narcotic pain medications, he is on a benzodiazepine medication, he is on a muscle relaxant, chronic and long-term Reglan which can cause parkinsonism.  He has had some memory and cognitive concerns, unfortunately, his multiple potentially sedating medications can exacerbate memory function, he is also on Lyrica.  He is advised not to take any Benadryl.  For sleep, he is encouraged to try melatonin.  He is advised to stay better hydrated with water and increase his water intake to 3-4 bottles of water per day, 16.9 ounce each.  He is encouraged to decrease his soda intake and limit himself to 2 to 3/day.  If he has persistent symptoms of delusion, he may benefit from seeing a psychiatrist. Furthermore, he is advised to quit smoking. He is encouraged to start using his cane consistently for gait safety. He declines a prescription for a rolling walker.   He is advised to keep his follow-up appointment with the nurse practitioner. I answered all their questions today and the patient was in agreement.

## 2020-03-28 ENCOUNTER — Ambulatory Visit: Payer: Medicare Other | Admitting: Cardiology

## 2020-03-28 ENCOUNTER — Encounter: Payer: Self-pay | Admitting: Cardiology

## 2020-03-28 ENCOUNTER — Other Ambulatory Visit: Payer: Self-pay

## 2020-03-28 VITALS — BP 120/84 | HR 70 | Ht 70.0 in | Wt 168.0 lb

## 2020-03-28 DIAGNOSIS — I480 Paroxysmal atrial fibrillation: Secondary | ICD-10-CM

## 2020-03-28 DIAGNOSIS — E782 Mixed hyperlipidemia: Secondary | ICD-10-CM | POA: Diagnosis not present

## 2020-03-28 DIAGNOSIS — I10 Essential (primary) hypertension: Secondary | ICD-10-CM | POA: Diagnosis not present

## 2020-03-28 DIAGNOSIS — I2510 Atherosclerotic heart disease of native coronary artery without angina pectoris: Secondary | ICD-10-CM

## 2020-03-28 DIAGNOSIS — E1110 Type 2 diabetes mellitus with ketoacidosis without coma: Secondary | ICD-10-CM

## 2020-03-28 MED ORDER — ISOSORBIDE MONONITRATE ER 60 MG PO TB24: 60.0000 mg | ORAL_TABLET | Freq: Every day | ORAL | 3 refills | Status: DC

## 2020-03-28 NOTE — Progress Notes (Signed)
Cardiology Office Note:    Date:  03/28/2020   ID:  Charles Huynh, DOB 04-02-1961, MRN [id number]  PCP:  Patient, No Pcp Per  Cardiologist:  Thomasene Ripple, DO  Electrophysiologist:  None   Referring MD: No ref. provider found   Chief Complaint  Patient presents with  . Follow-up    History of Present Illness:    Charles Huynh is a 59 y.o. male with a hx of disease seen on coronary CTA with FFR normal with no suggestion of flow limiting, hypertension, atrial fibrillation on Eliquis, diabetes on insulin, hyperlipidemia and chronic smoker.  I last saw the patient on 01/02/2020 at that time I started the patient on Imdur 30 mg daily.  Anginal symptoms.  Tells me that this has improved some but he still has intermittent chest pains on exertion.  He reports that 81 mg daily along with his lovastatin 20 milligrams daily.  He has been undergoing great deal of stress with his family life.  He notes that yesterday he has been significant palpitations he thinks that he did not take his metoprolol the night before.  No repeated symptoms.  This was an isolated incident.  NO other complaints at this time.  Past Medical History:  Diagnosis Date  . Anxiety   . Atrial fibrillation (HCC)   . Depression   . Diabetes (HCC)   . Heart disease   . Hx pulmonary embolism   . Hyperlipidemia   . Hypertension   . Parkinson's disease (HCC)   . Stroke (HCC)   . Thyroid disease     Past Surgical History:  Procedure Laterality Date  . BACK SURGERY    . CARDIAC CATHETERIZATION    . KNEE ARTHROSCOPY Left   . NECK SURGERY    . SPINAL CORD STIMULATOR INSERTION    . WRIST SURGERY Bilateral     Current Medications: Current Meds  Medication Sig  . albuterol (VENTOLIN HFA) 108 (90 Base) MCG/ACT inhaler Inhale 2 puffs into the lungs every 4 (four) hours as needed.  Marland Kitchen aspirin EC 81 MG tablet Take 1 tablet (81 mg total) by mouth daily.  . baclofen (LIORESAL) 10 MG tablet TK 1 T PO BID  .  carbidopa-levodopa (SINEMET CR) 50-200 MG tablet TK 2 TS PO BID  . citalopram (CELEXA) 40 MG tablet TK 1 T PO QD  . DILT-XR 120 MG 24 hr capsule TK 1 C PO QD  . diphenhydrAMINE (BENADRYL) 25 mg capsule Take 25 mg by mouth at bedtime.  Marland Kitchen ELIQUIS 5 MG TABS tablet Take 5 mg by mouth 2 (two) times daily.  Marland Kitchen esomeprazole (NEXIUM) 40 MG capsule Take 40 mg by mouth daily at 12 noon.  . fenofibrate 160 MG tablet TK 1 T PO QD  . fentaNYL (DURAGESIC) 50 MCG/HR 1 patch as directed. Apply 1 patch every 2.5 days  . hydrochlorothiazide (MICROZIDE) 12.5 MG capsule TK 1 C PO QD  . JANUVIA 100 MG tablet Take 100 mg by mouth daily.  Marland Kitchen JARDIANCE 25 MG TABS tablet Take 25 mg by mouth daily.  Marland Kitchen levothyroxine (SYNTHROID) 50 MCG tablet TK 1 T PO QD  . lovastatin (MEVACOR) 20 MG tablet Take 1 tablet by mouth daily.  . metFORMIN (GLUCOPHAGE) 500 MG tablet TAKE 2 TABLETS PO BID  . metoCLOPramide (REGLAN) 10 MG tablet TK 1 T PO BID  . metoprolol tartrate (LOPRESSOR) 100 MG tablet TAKE 1 TABLET PO BID  . nitroGLYCERIN (NITROSTAT) 0.4 MG SL tablet PLACE 1  TABLET UNDER THE TONGUE EVERY 5 MINUTES AS NEEDED FOR CHEST PAIN  . oxyCODONE-acetaminophen (PERCOCET) 10-325 MG tablet Take 1 tablet by mouth 4 (four) times daily.   . potassium chloride (K-DUR) 10 MEQ tablet TK 1 T PO QD  . pregabalin (LYRICA) 300 MG capsule TK ONE C PO Q 12 H  . promethazine (PHENERGAN) 25 MG tablet Take 25 mg by mouth every 6 (six) hours as needed for nausea or vomiting.  Marland Kitchen testosterone cypionate (DEPOTESTOSTERONE CYPIONATE) 200 MG/ML injection Inject 200 mg into the muscle every 14 (fourteen) days.  Marland Kitchen TRIAZOLAM PO Take 1 tablet by mouth at bedtime.  . [DISCONTINUED] isosorbide mononitrate (IMDUR) 30 MG 24 hr tablet Take 1 tablet (30 mg total) by mouth daily.     Allergies:   Aleve [naproxen sodium]   Social History   Socioeconomic History  . Marital status: Married    Spouse name: Not on file  . Number of children: Not on file  . Years of  education: Not on file  . Highest education level: Not on file  Occupational History  . Not on file  Tobacco Use  . Smoking status: Current Every Day Smoker    Packs/day: 1.00    Years: 47.00    Pack years: 47.00    Types: Cigarettes  . Smokeless tobacco: Never Used  Substance and Sexual Activity  . Alcohol use: Yes    Comment: very seldom; 1 -2 times per year  . Drug use: Never  . Sexual activity: Not on file  Other Topics Concern  . Not on file  Social History Narrative  . Not on file   Social Determinants of Health   Financial Resource Strain:   . Difficulty of Paying Living Expenses:   Food Insecurity:   . Worried About Programme researcher, broadcasting/film/video in the Last Year:   . Barista in the Last Year:   Transportation Needs:   . Freight forwarder (Medical):   Marland Kitchen Lack of Transportation (Non-Medical):   Physical Activity:   . Days of Exercise per Week:   . Minutes of Exercise per Session:   Stress:   . Feeling of Stress :   Social Connections:   . Frequency of Communication with Friends and Family:   . Frequency of Social Gatherings with Friends and Family:   . Attends Religious Services:   . Active Member of Clubs or Organizations:   . Attends Banker Meetings:   Marland Kitchen Marital Status:      Family History: The patient's family history includes Emphysema in his father; Heart attack in his father; Heart disease in his father; Lung cancer in his mother.  ROS:   Review of Systems  Constitution: Negative for decreased appetite, fever and weight gain.  HENT: Negative for congestion, ear discharge, hoarse voice and sore throat.   Eyes: Negative for discharge, redness, vision loss in right eye and visual halos.  Cardiovascular: Reports intermittent chest pain. Negative for dyspnea on exertion, leg swelling, orthopnea and palpitations.  Respiratory: Negative for cough, hemoptysis, shortness of breath and snoring.   Endocrine: Negative for heat intolerance and  polyphagia.  Hematologic/Lymphatic: Negative for bleeding problem. Does not bruise/bleed easily.  Skin: Negative for flushing, nail changes, rash and suspicious lesions.  Musculoskeletal: Negative for arthritis, joint pain, muscle cramps, myalgias, neck pain and stiffness.  Gastrointestinal: Negative for abdominal pain, bowel incontinence, diarrhea and excessive appetite.  Genitourinary: Negative for decreased libido, genital sores and incomplete emptying.  Neurological: Negative for brief paralysis, focal weakness, headaches and loss of balance.  Psychiatric/Behavioral: Negative for altered mental status, depression and suicidal ideas.  Allergic/Immunologic: Negative for HIV exposure and persistent infections.    EKGs/Labs/Other Studies Reviewed:    The following studies were reviewed today:   EKG:  None today  Zio Monitor The patient wore the monitor for 7 days. Indication: Paroxysmal atrial fibrillation. The minimal HR was 59 bpm, with maximal HR of 164 bpm, and average HR of 76 bpm. Predominant underlying rhythm was Sinus Rhythm. Bundle Branch Block/IVCD was present.  QRS morphology changes were present throughout recording.  4 Supraventricular Tachycardia runs occurred, the run with the fastest interval lasting 10 beats with a max rate of 164 bpm, the longest lasting 12 beats with an average rate of 139bpm. The supraventricular tachycardia is likely atrial tachycardia. Supraventricular Tachycardia was associated with of symptomatic patient triggered event. Premature atrial complexes were rare (<1.0%). Premature ventricular complexes were rare (<1.0%), No AV block, no pauses, no atrial fibrillation.  Transthoracic echocardiogram 10/31/2019 IMPRESSIONS 1. Left ventricular ejection fraction, by visual estimation, is 60 to 65%. The left ventricle has normal function. Left ventricular septal wall thickness was severely increased. Mildly increased left ventricular posterior wall  thickness. There is  asymetrical left ventricular hypertrophy with sigmoidal basal septum thickness. 2. Left ventricular diastolic parameters are consistent with Grade II diastolic dysfunction (pseudonormalization). 3. Global right ventricle has normal systolic function.The right ventricular size is normal. No increase in right ventricular wall thickness. 4. Left atrial size was normal. 5. Right atrial size was normal. 6. Mild mitral annular calcification. 7. The mitral valve is normal in structure. Trace mitral valve regurgitation. No evidence of mitral stenosis. 8. The tricuspid valve is normal in structure. Tricuspid valve regurgitation is not demonstrated. 9. The pulmonic valve was normal in structure. Pulmonic valve regurgitation is not visualized. 10. The aortic valve is normal in structure. Aortic valve regurgitation is not visualized. No evidence of aortic valve sclerosis or stenosis. 11. Normal pulmonary artery systolic pressure.  Coronary CTA 10/12/2019 Coronary calcium score: The patient's coronary artery calcium score is 50.35, which places the patient in the 57th percentile. Calcium noted in the LAD and LCx.  Coronary arteries: Normal coronary origins. Left dominance.  Right Coronary Artery: Dominant vessel. No significant stenosis - mis-registration artifact in the mid-vessel.  Left Main Coronary Artery: Appears normal. Bifurcates into the LAD and LCx vessels.  Left Anterior Descending Coronary Artery: Coarses anteriorly and gives off a large high diagonal branch, neither branch reaches the apex. There is minimal 1-24% proximal mixed stenosis (CADRADS1).  Left Circumflex Artery: The AV groove LCX demonstrates no significant disease. There is a large OM1 branch with multiple sub-branches. A moderate 50-69% mixed stenosis (CADRADS3) of the proximal OM is noted.  Aorta: Normal size, 26 mm at the mid ascending aorta (level of the PA bifurcation) measured  double oblique. No calcifications. No Dissection. IMPRESSION: 1. Moderate stenosis of a large OM branch and minimal LAD disease, CADRADS = 3. CT FFR will be performed and reported separately.  2. Coronary calcium score of 50.35. This was 57th percentile for age and sex matched control.  3. Normal coronary origin with left dominance.  4. Mildly dilated main PA at 30 mm.   Recent Labs: 10/10/2019: Creatinine, Ser 1.00  Recent Lipid Panel No results found for: CHOL, TRIG, HDL, CHOLHDL, VLDL, LDLCALC, LDLDIRECT  Physical Exam:    VS:  BP 120/84 (BP Location: Right Arm, Patient Position: Sitting,  Cuff Size: Normal)   Pulse 70   Ht 5\' 10"  (1.778 m)   Wt 168 lb (76.2 kg)   SpO2 94%   BMI 24.11 kg/m     Wt Readings from Last 3 Encounters:  03/28/20 168 lb (76.2 kg)  03/06/20 171 lb (77.6 kg)  01/05/20 176 lb 5 oz (80 kg)     GEN: Well nourished, well developed in no acute distress HEENT: Normal NECK: No JVD; No carotid bruits LYMPHATICS: No lymphadenopathy CARDIAC: S1S2 noted,RRR, no murmurs, rubs, gallops RESPIRATORY:  Clear to auscultation without rales, wheezing or rhonchi  ABDOMEN: Soft, non-tender, non-distended, +bowel sounds, no guarding. EXTREMITIES: No edema, No cyanosis, no clubbing MUSCULOSKELETAL:  No deformity  SKIN: Warm and dry NEUROLOGIC:  Alert and oriented x 3, non-focal PSYCHIATRIC:  Normal affect, good insight  ASSESSMENT:    1. Coronary artery disease involving native coronary artery of native heart, angina presence unspecified   2. Paroxysmal atrial fibrillation (HCC)   3. Essential hypertension   4. Mixed hyperlipidemia   5. Type 2 diabetes mellitus with ketoacidosis without coma, without long-term current use of insulin (HCC)    PLAN:    Coronary artery disease-given his intermittent symptoms though I am going to increase his Toprol to 60 mg daily hopefully this will help resolve the symptoms.  We will continue his aspirin 81 mg daily  along with atorvastatin 20 mg daily.  PAF- continue patient on eliquis 5 mg BID and rate control with beta blocker. Recent ekg patient in sinus rhythm. We discuss he will stay with rate control for now.  Hyperlipidemia - continue current dose of lovastatin.  DM - continue management per PCP.  The patient is in agreement with the above plan. The patient left the office in stable condition.  The patient will follow up in 3 mont   Medication Adjustments/Labs and Tests Ordered: Current medicines are reviewed at length with the patient today.  Concerns regarding medicines are outlined above.  No orders of the defined types were placed in this encounter.  Meds ordered this encounter  Medications  . isosorbide mononitrate (IMDUR) 60 MG 24 hr tablet    Sig: Take 1 tablet (60 mg total) by mouth daily.    Dispense:  90 tablet    Refill:  3    Dose increased    Patient Instructions  Medication Instructions:  Your physician has recommended you make the following change in your medication:   1) INCREASE Imdur to 60 mg daily  *If you need a refill on your cardiac medications before your next appointment, please call your pharmacy*  Follow-Up: At [facility], you and your health needs are our priority.  As part of our continuing mission to provide you with exceptional heart care, we have created designated Provider Care Teams.  These Care Teams include your primary Cardiologist (physician) and Advanced Practice Providers (APPs -  Physician Assistants and Nurse Practitioners) who all work together to provide you with the care you need, when you need it.  We recommend signing up for the patient portal called "MyChart".  Sign up information is provided on this After Visit Summary.  MyChart is used to connect with patients for Virtual Visits (Telemedicine).  Patients are able to view lab/test results, encounter notes, upcoming appointments, etc.  Non-urgent messages can be sent to your provider as  well.   To learn more about what you can do with MyChart, go to [email or web address].    Your next appointment:  3 month(s)  The format for your next appointment:   In Person  Provider:   You will see Thomasene Ripple, DO.  Or, you can be scheduled with the following Advanced Practice Provider on your designated Care Team (at our [facility]):  Gillian Shields, FNP       Adopting a Healthy Lifestyle.  Know what a healthy weight is for you (roughly BMI <25) and aim to maintain this   Aim for 7+ servings of fruits and vegetables daily   65-80+ fluid ounces of water or unsweet tea for healthy kidneys   Limit to max 1 drink of alcohol per day; avoid smoking/tobacco   Limit animal fats in diet for cholesterol and heart health - choose grass fed whenever available   Avoid highly processed foods, and foods high in saturated/trans fats   Aim for low stress - take time to unwind and care for your mental health   Aim for 150 min of moderate intensity exercise weekly for heart health, and weights twice weekly for bone health   Aim for 7-9 hours of sleep daily   When it comes to diets, agreement about the perfect plan isnt easy to find, even among the experts. Experts at the [facility] [employer] developed an idea known as the Healthy Eating Plate. Just imagine a plate divided into logical, healthy portions.   The emphasis is on diet quality:   Load up on vegetables and fruits - one-half of your plate: Aim for color and variety, and remember that potatoes dont count.   Go for whole grains - one-quarter of your plate: Whole wheat, barley, wheat berries, quinoa, oats, brown rice, and foods made with them. If you want pasta, go with whole wheat pasta.   Protein power - one-quarter of your plate: Fish, chicken, beans, and nuts are all healthy, versatile protein sources. Limit red meat.   The diet, however, does go beyond the plate, offering a few other suggestions.     Use healthy plant oils, such as olive, canola, soy, corn, sunflower and peanut. Check the labels, and avoid partially hydrogenated oil, which have unhealthy trans fats.   If youre thirsty, drink water. Coffee and tea are good in moderation, but skip sugary drinks and limit milk and dairy products to one or two daily servings.   The type of carbohydrate in the diet is more important than the amount. Some sources of carbohydrates, such as vegetables, fruits, whole grains, and beans-are healthier than others.   Finally, stay active  Signed, Thomasene Ripple, DO  03/28/2020 3:50 PM    [facility]

## 2020-03-28 NOTE — Patient Instructions (Signed)
Medication Instructions:  Your physician has recommended you make the following change in your medication:   1) INCREASE Imdur to 60 mg daily  *If you need a refill on your cardiac medications before your next appointment, please call your pharmacy*  Follow-Up: At [facility], you and your health needs are our priority.  As part of our continuing mission to provide you with exceptional heart care, we have created designated Provider Care Teams.  These Care Teams include your primary Cardiologist (physician) and Advanced Practice Providers (APPs -  Physician Assistants and Nurse Practitioners) who all work together to provide you with the care you need, when you need it.  We recommend signing up for the patient portal called "MyChart".  Sign up information is provided on this After Visit Summary.  MyChart is used to connect with patients for Virtual Visits (Telemedicine).  Patients are able to view lab/test results, encounter notes, upcoming appointments, etc.  Non-urgent messages can be sent to your provider as well.   To learn more about what you can do with MyChart, go to [email or web address].    Your next appointment:   3 month(s)  The format for your next appointment:   In Person  Provider:   You will see Thomasene Ripple, DO.  Or, you can be scheduled with the following Advanced Practice Provider on your designated Care Team (at our [facility]):  Gillian Shields, FNP

## 2020-04-02 ENCOUNTER — Ambulatory Visit: Payer: Medicare Other | Admitting: Cardiology

## 2020-05-09 ENCOUNTER — Ambulatory Visit: Payer: Medicare Other | Admitting: Adult Health

## 2020-05-24 ENCOUNTER — Encounter: Payer: Self-pay | Admitting: Adult Health

## 2020-05-24 ENCOUNTER — Ambulatory Visit: Payer: Medicare Other | Admitting: Adult Health

## 2020-06-27 ENCOUNTER — Ambulatory Visit: Payer: Medicare Other | Admitting: Cardiology

## 2020-08-20 ENCOUNTER — Other Ambulatory Visit: Payer: Self-pay | Admitting: Cardiology

## 2020-10-25 ENCOUNTER — Other Ambulatory Visit: Payer: Self-pay

## 2020-10-25 DIAGNOSIS — Z86711 Personal history of pulmonary embolism: Secondary | ICD-10-CM | POA: Insufficient documentation

## 2020-10-25 DIAGNOSIS — F32A Depression, unspecified: Secondary | ICD-10-CM | POA: Insufficient documentation

## 2020-10-25 DIAGNOSIS — I10 Essential (primary) hypertension: Secondary | ICD-10-CM | POA: Insufficient documentation

## 2020-10-25 DIAGNOSIS — F419 Anxiety disorder, unspecified: Secondary | ICD-10-CM | POA: Insufficient documentation

## 2020-10-25 DIAGNOSIS — E079 Disorder of thyroid, unspecified: Secondary | ICD-10-CM | POA: Insufficient documentation

## 2020-10-25 DIAGNOSIS — I519 Heart disease, unspecified: Secondary | ICD-10-CM | POA: Insufficient documentation

## 2020-10-25 DIAGNOSIS — E785 Hyperlipidemia, unspecified: Secondary | ICD-10-CM | POA: Insufficient documentation

## 2020-10-26 ENCOUNTER — Other Ambulatory Visit: Payer: Self-pay

## 2020-10-26 ENCOUNTER — Ambulatory Visit: Payer: Medicare Other | Admitting: Cardiology

## 2020-10-26 ENCOUNTER — Encounter: Payer: Self-pay | Admitting: Cardiology

## 2020-10-26 VITALS — BP 124/66 | HR 90 | Ht 69.0 in | Wt 174.2 lb

## 2020-10-26 DIAGNOSIS — I25118 Atherosclerotic heart disease of native coronary artery with other forms of angina pectoris: Secondary | ICD-10-CM

## 2020-10-26 DIAGNOSIS — E119 Type 2 diabetes mellitus without complications: Secondary | ICD-10-CM | POA: Diagnosis not present

## 2020-10-26 DIAGNOSIS — E782 Mixed hyperlipidemia: Secondary | ICD-10-CM

## 2020-10-26 DIAGNOSIS — I10 Essential (primary) hypertension: Secondary | ICD-10-CM | POA: Diagnosis not present

## 2020-10-26 DIAGNOSIS — I480 Paroxysmal atrial fibrillation: Secondary | ICD-10-CM

## 2020-10-26 MED ORDER — ELIQUIS 5 MG PO TABS: 5.0000 mg | ORAL_TABLET | Freq: Two times a day (BID) | ORAL | 4 refills | Status: DC

## 2020-10-26 MED ORDER — METOPROLOL TARTRATE 100 MG PO TABS: 100.0000 mg | ORAL_TABLET | Freq: Two times a day (BID) | ORAL | 3 refills | Status: AC

## 2020-10-26 MED ORDER — ISOSORBIDE MONONITRATE ER 60 MG PO TB24: 60.0000 mg | ORAL_TABLET | Freq: Every day | ORAL | 3 refills | Status: DC

## 2020-10-26 MED ORDER — POTASSIUM CHLORIDE ER 10 MEQ PO TBCR: EXTENDED_RELEASE_TABLET | ORAL | 3 refills | Status: DC

## 2020-10-26 MED ORDER — HYDROCHLOROTHIAZIDE 12.5 MG PO CAPS: 12.5000 mg | ORAL_CAPSULE | Freq: Every day | ORAL | 3 refills | Status: DC

## 2020-10-26 NOTE — Progress Notes (Signed)
Cardiology Office Note:    Date:  10/26/2020   ID:  Charles Huynh, DOB 1961-11-01, MRN [id number]  PCP:  Patient, No Pcp Per  Cardiologist:  Thomasene Ripple, DO  Electrophysiologist:  None   Referring MD: No ref. provider found   "I am doing well"  History of Present Illness:    Charles Huynh is a 59 y.o. male with a hx of PAF on Eliquis, history of CVA, coronary artery disease, diabetes, chronic smoker hyperlipidemia and hypertension.  I last saw the patient in January 2021 at that time he was going through a great deal of stress with his family life.  In the interim he has had some changes with his medication as he has had some psychosis.  During his last visit I continued the patient on his beta-blocker for his paroxysmal atrial fibrillation and his Eliquis. Since his visit he tells me that he still has been continuing to have stressful time with his family life and is going to a divorce.  Past Medical History:  Diagnosis Date  . Anxiety   . Atrial fibrillation (HCC)   . Cerebrovascular accident (HCC) 07/09/2017  . Chronic pain syndrome 07/09/2017  . Coronary artery disease of native artery of native heart with stable angina pectoris (HCC) 01/03/2020  . 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 Depression   . Diabetes (HCC)   . Essential hypertension 08/30/2019  . Gastroesophageal reflux disease without esophagitis 09/02/2017  . Heart disease   . Hx pulmonary embolism   . Hyperlipidemia   . Hypertension   . Hypothyroidism 07/09/2017  . Mixed hyperlipidemia 01/03/2020  . PAF (paroxysmal atrial fibrillation) (HCC) 01/03/2020  . Parkinson's disease (HCC)   . Stroke (HCC)   . Thyroid disease   . Type 2 diabetes mellitus without complication (HCC) 07/09/2017    Past Surgical History:  Procedure Laterality Date  . BACK SURGERY    . CARDIAC CATHETERIZATION    . KNEE ARTHROSCOPY Left   . NECK SURGERY    . SPINAL CORD STIMULATOR INSERTION    . WRIST SURGERY Bilateral      Current Medications: Current Meds  Medication Sig  . albuterol (VENTOLIN HFA) 108 (90 Base) MCG/ACT inhaler Inhale 2 puffs into the lungs every 4 (four) hours as needed.  Marland Kitchen aspirin EC 81 MG tablet Take 1 tablet (81 mg total) by mouth daily.  . baclofen (LIORESAL) 10 MG tablet TK 1 T PO BID  . carbidopa-levodopa (SINEMET CR) 50-200 MG tablet TK 2 TS PO BID  . citalopram (CELEXA) 40 MG tablet TK 1 T PO QD  . DILT-XR 120 MG 24 hr capsule TK 1 C PO QD  . diphenhydrAMINE (BENADRYL) 25 mg capsule Take 25 mg by mouth at bedtime.  Marland Kitchen ELIQUIS 5 MG TABS tablet Take 5 mg by mouth 2 (two) times daily.  Marland Kitchen esomeprazole (NEXIUM) 40 MG capsule Take 40 mg by mouth daily at 12 noon.  . fenofibrate 160 MG tablet TK 1 T PO QD  . fentaNYL (DURAGESIC) 50 MCG/HR 1 patch as directed. Apply 1 patch every 2.5 days  . glipiZIDE (GLUCOTROL XL) 5 MG 24 hr tablet Take 5 mg by mouth daily.  . hydrochlorothiazide (MICROZIDE) 12.5 MG capsule Take 1 capsule (12.5 mg total) by mouth daily.  . isosorbide mononitrate (IMDUR) 60 MG 24 hr tablet Take 1 tablet (60 mg total) by mouth daily.  Marland Kitchen JANUVIA 100 MG tablet Take 100 mg by mouth daily.  Marland Kitchen JARDIANCE 25 MG TABS  tablet Take 25 mg by mouth daily.  Marland Kitchen levothyroxine (SYNTHROID) 50 MCG tablet TK 1 T PO QD  . lovastatin (MEVACOR) 20 MG tablet Take 1 tablet by mouth daily.  . metFORMIN (GLUCOPHAGE) 500 MG tablet TAKE 2 TABLETS PO BID  . metFORMIN (GLUCOPHAGE-XR) 500 MG 24 hr tablet Take 1,000 mg by mouth 2 (two) times daily.  . metoCLOPramide (REGLAN) 10 MG tablet TK 1 T PO BID  . metoprolol tartrate (LOPRESSOR) 100 MG tablet Take 1 tablet (100 mg total) by mouth 2 (two) times daily.  . nitroGLYCERIN (NITROSTAT) 0.4 MG SL tablet PLACE 1 TABLET UNDER THE TONGUE EVERY 5 MINUTES AS NEEDED FOR CHEST PAIN  . NUPLAZID 34 MG CAPS Take 1 capsule by mouth daily.  Marland Kitchen oxyCODONE-acetaminophen (PERCOCET) 10-325 MG tablet Take 1 tablet by mouth 4 (four) times daily.   . potassium chloride  (KLOR-CON) 10 MEQ tablet Take 1 tablet by mouth daily  . pregabalin (LYRICA) 300 MG capsule TK ONE C PO Q 12 H  . promethazine (PHENERGAN) 25 MG tablet Take 25 mg by mouth every 6 (six) hours as needed for nausea or vomiting.  Marland Kitchen QUEtiapine (SEROQUEL) 50 MG tablet Take 50 mg by mouth at bedtime.  Marland Kitchen testosterone cypionate (DEPOTESTOSTERONE CYPIONATE) 200 MG/ML injection Inject 200 mg into the muscle every 14 (fourteen) days.  Marland Kitchen triamcinolone cream (KENALOG) 0.1 % in the morning and at bedtime.  Marland Kitchen TRIAZOLAM PO Take 1 tablet by mouth at bedtime.  . [DISCONTINUED] hydrochlorothiazide (MICROZIDE) 12.5 MG capsule TK 1 C PO QD  . [DISCONTINUED] isosorbide mononitrate (IMDUR) 60 MG 24 hr tablet Take 1 tablet (60 mg total) by mouth daily.  . [DISCONTINUED] metoprolol tartrate (LOPRESSOR) 100 MG tablet TAKE 1 TABLET PO BID  . [DISCONTINUED] potassium chloride (K-DUR) 10 MEQ tablet TK 1 T PO QD     Allergies:   Naproxen sodium   Social History   Socioeconomic History  . Marital status: Married    Spouse name: Not on file  . Number of children: Not on file  . Years of education: Not on file  . Highest education level: Not on file  Occupational History  . Not on file  Tobacco Use  . Smoking status: Current Every Day Smoker    Packs/day: 1.00    Years: 47.00    Pack years: 47.00    Types: Cigarettes  . Smokeless tobacco: Never Used  Vaping Use  . Vaping Use: Never used  Substance and Sexual Activity  . Alcohol use: Yes    Comment: very seldom; 1 -2 times per year  . Drug use: Never  . Sexual activity: Not on file  Other Topics Concern  . Not on file  Social History Narrative  . Not on file   Social Determinants of Health   Financial Resource Strain:   . Difficulty of Paying Living Expenses: Not on file  Food Insecurity:   . Worried About Programme researcher, broadcasting/film/video in the Last Year: Not on file  . Ran Out of Food in the Last Year: Not on file  Transportation Needs:   . Lack of  Transportation (Medical): Not on file  . Lack of Transportation (Non-Medical): Not on file  Physical Activity:   . Days of Exercise per Week: Not on file  . Minutes of Exercise per Session: Not on file  Stress:   . Feeling of Stress : Not on file  Social Connections:   . Frequency of Communication with Friends and Family:  Not on file  . Frequency of Social Gatherings with Friends and Family: Not on file  . Attends Religious Services: Not on file  . Active Member of Clubs or Organizations: Not on file  . Attends Banker Meetings: Not on file  . Marital Status: Not on file     Family History: The patient's family history includes Emphysema in his father; Heart attack in his father; Heart disease in his father; Lung cancer in his mother.  ROS:   Review of Systems  Constitution: Negative for decreased appetite, fever and weight gain.  HENT: Negative for congestion, ear discharge, hoarse voice and sore throat.   Eyes: Negative for discharge, redness, vision loss in right eye and visual halos.  Cardiovascular: Negative for chest pain, dyspnea on exertion, leg swelling, orthopnea and palpitations.  Respiratory: Negative for cough, hemoptysis, shortness of breath and snoring.   Endocrine: Negative for heat intolerance and polyphagia.  Hematologic/Lymphatic: Negative for bleeding problem. Does not bruise/bleed easily.  Skin: Negative for flushing, nail changes, rash and suspicious lesions.  Musculoskeletal: Negative for arthritis, joint pain, muscle cramps, myalgias, neck pain and stiffness.  Gastrointestinal: Negative for abdominal pain, bowel incontinence, diarrhea and excessive appetite.  Genitourinary: Negative for decreased libido, genital sores and incomplete emptying.  Neurological: Negative for brief paralysis, focal weakness, headaches and loss of balance.  Psychiatric/Behavioral: Negative for altered mental status, depression and suicidal ideas.  Allergic/Immunologic:  Negative for HIV exposure and persistent infections.    EKGs/Labs/Other Studies Reviewed:    The following studies were reviewed today:   EKG:  The ekg ordered today demonstrates sinus rhythm, heart rate 90 bpm with left bundle branch block.  Zio Monitor The patient wore the monitor for 7 days. Indication: Paroxysmal atrial fibrillation. The minimal HR was 59 bpm, with maximal HR of 164 bpm, and average HR of 76 bpm. Predominant underlying rhythm was Sinus Rhythm. Bundle Branch Block/IVCD was present.  QRS morphology changes were present throughout recording.  4 Supraventricular Tachycardia runs occurred, the run with the fastest interval lasting 10 beats with a max rate of 164 bpm, the longest lasting 12 beats with an average rate of 139bpm. The supraventricular tachycardia is likely atrial tachycardia. Supraventricular Tachycardia was associated with of symptomatic patient triggered event. Premature atrial complexes were rare (<1.0%). Premature ventricular complexes were rare (<1.0%), No AV block, no pauses, no atrial fibrillation.  Transthoracic echocardiogram11/16/2020 IMPRESSIONS 1. Left ventricular ejection fraction, by visual estimation, is 60 to 65%. The left ventricle has normal function. Left ventricular septal wall thickness was severely increased. Mildly increased left ventricular posterior wall thickness. There is  asymetrical left ventricular hypertrophy with sigmoidal basal septum thickness. 2. Left ventricular diastolic parameters are consistent with Grade II diastolic dysfunction (pseudonormalization). 3. Global right ventricle has normal systolic function.The right ventricular size is normal. No increase in right ventricular wall thickness. 4. Left atrial size was normal. 5. Right atrial size was normal. 6. Mild mitral annular calcification. 7. The mitral valve is normal in structure. Trace mitral valve regurgitation. No evidence of mitral stenosis. 8. The  tricuspid valve is normal in structure. Tricuspid valve regurgitation is not demonstrated. 9. The pulmonic valve was normal in structure. Pulmonic valve regurgitation is not visualized. 10. The aortic valve is normal in structure. Aortic valve regurgitation is not visualized. No evidence of aortic valve sclerosis or stenosis. 11. Normal pulmonary artery systolic pressure.  Coronary CTA10/28/2020 Coronary calcium score: The patient's coronary artery calcium score is 50.35, which places the patient  in the 57th percentile. Calcium noted in the LAD and LCx.  Coronary arteries: Normal coronary origins. Left dominance.  Right Coronary Artery: Dominant vessel. No significant stenosis - mis-registration artifact in the mid-vessel.  Left Main Coronary Artery: Appears normal. Bifurcates into the LAD and LCx vessels.  Left Anterior Descending Coronary Artery: Coarses anteriorly and gives off a large high diagonal branch, neither branch reaches the apex. There is minimal 1-24% proximal mixed stenosis (CADRADS1).  Left Circumflex Artery: The AV groove LCX demonstrates no significant disease. There is a large OM1 branch with multiple sub-branches. A moderate 50-69% mixed stenosis (CADRADS3) of the proximal OM is noted.  Aorta: Normal size, 26 mm at the mid ascending aorta (level of the PA bifurcation) measured double oblique. No calcifications. No Dissection. IMPRESSION: 1. Moderate stenosis of a large OM branch and minimal LAD disease, CADRADS = 3. CT FFR will be performed and reported separately.  2. Coronary calcium score of 50.35. This was 57th percentile for age and sex matched control.  3. Normal coronary origin with left dominance.  4. Mildly dilated main PA at 30 mm.    Recent Labs: No results found for requested labs within last 8760 hours.  Recent Lipid Panel No results found for: CHOL, TRIG, HDL, CHOLHDL, VLDL, LDLCALC, LDLDIRECT  Physical Exam:    VS:   BP 124/66   Pulse 90   Ht 5\' 9"  (1.753 m)   Wt 174 lb 3.2 oz (79 kg)   SpO2 95%   BMI 25.72 kg/m     Wt Readings from Last 3 Encounters:  10/26/20 174 lb 3.2 oz (79 kg)  03/28/20 168 lb (76.2 kg)  03/06/20 171 lb (77.6 kg)     GEN: Well nourished, well developed in no acute distress HEENT: Normal NECK: No JVD; No carotid bruits LYMPHATICS: No lymphadenopathy CARDIAC: S1S2 noted,RRR, no murmurs, rubs, gallops RESPIRATORY:  Clear to auscultation without rales, wheezing or rhonchi  ABDOMEN: Soft, non-tender, non-distended, +bowel sounds, no guarding. EXTREMITIES: No edema, No cyanosis, no clubbing MUSCULOSKELETAL:  No deformity  SKIN: Warm and dry NEUROLOGIC:  Alert and oriented x 3, non-focal PSYCHIATRIC:  Normal affect, good insight  ASSESSMENT:    1. Coronary artery disease of native artery of native heart with stable angina pectoris (HCC)   2. Essential hypertension   3. PAF (paroxysmal atrial fibrillation) (HCC)   4. Primary hypertension   5. Type 2 diabetes mellitus without complication, without long-term current use of insulin (HCC)   6. Mixed hyperlipidemia    PLAN:     He appears to be stable from a cardiovascular standpoint.  I am going to continue the patient current medication regimen.  His blood pressure is acceptable in the office no changes will be made.  We discussed his new medication he has been placed on by his neurologist and I do believe he should continue this medication and we will keep an eye on the patient for any side effects.  He will let me know if he has had any increased palpitations for 03/08/20 to put a monitor on him to make sure that he is not having any excessive arrhythmias.  The patient is in agreement with the above plan. The patient left the office in stable condition.  The patient will follow up in 6 months.   Medication Adjustments/Labs and Tests Ordered: Current medicines are reviewed at length with the patient today.  Concerns regarding  medicines are outlined above.  Orders Placed This Encounter  Procedures  .  EKG 12-Lead   Meds ordered this encounter  Medications  . isosorbide mononitrate (IMDUR) 60 MG 24 hr tablet    Sig: Take 1 tablet (60 mg total) by mouth daily.    Dispense:  90 tablet    Refill:  3    Dose increased  . potassium chloride (KLOR-CON) 10 MEQ tablet    Sig: Take 1 tablet by mouth daily    Dispense:  90 tablet    Refill:  3  . metoprolol tartrate (LOPRESSOR) 100 MG tablet    Sig: Take 1 tablet (100 mg total) by mouth 2 (two) times daily.    Dispense:  180 tablet    Refill:  3  . hydrochlorothiazide (MICROZIDE) 12.5 MG capsule    Sig: Take 1 capsule (12.5 mg total) by mouth daily.    Dispense:  90 capsule    Refill:  3    Patient Instructions  Medication Instructions:  Your physician recommends that you continue on your current medications as directed. Please refer to the Current Medication list given to you today.  *If you need a refill on your cardiac medications before your next appointment, please call your pharmacy*   Lab Work: None ordered   If you have labs (blood work) drawn today and your tests are completely normal, you will receive your results only by: Marland Kitchen MyChart Message (if you have MyChart) OR . A paper copy in the mail If you have any lab test that is abnormal or we need to change your treatment, we will call you to review the results.   Testing/Procedures: None ordered    Follow-Up: At [facility], you and your health needs are our priority.  As part of our continuing mission to provide you with exceptional heart care, we have created designated Provider Care Teams.  These Care Teams include your primary Cardiologist (physician) and Advanced Practice Providers (APPs -  Physician Assistants and Nurse Practitioners) who all work together to provide you with the care you need, when you need it.  We recommend signing up for the patient portal called "MyChart".  Sign up  information is provided on this After Visit Summary.  MyChart is used to connect with patients for Virtual Visits (Telemedicine).  Patients are able to view lab/test results, encounter notes, upcoming appointments, etc.  Non-urgent messages can be sent to your provider as well.   To learn more about what you can do with MyChart, go to [email or web address].    Your next appointment:   6 month(s)  The format for your next appointment:   In Person  Provider:   Thomasene Ripple, DO   Other Instructions None      Adopting a Healthy Lifestyle.  Know what a healthy weight is for you (roughly BMI <25) and aim to maintain this   Aim for 7+ servings of fruits and vegetables daily   65-80+ fluid ounces of water or unsweet tea for healthy kidneys   Limit to max 1 drink of alcohol per day; avoid smoking/tobacco   Limit animal fats in diet for cholesterol and heart health - choose grass fed whenever available   Avoid highly processed foods, and foods high in saturated/trans fats   Aim for low stress - take time to unwind and care for your mental health   Aim for 150 min of moderate intensity exercise weekly for heart health, and weights twice weekly for bone health   Aim for 7-9 hours of sleep daily   When it comes  to diets, agreement about the perfect plan isnt easy to find, even among the experts. Experts at the [employer] developed an idea known as the Healthy Eating Plate. Just imagine a plate divided into logical, healthy portions.   The emphasis is on diet quality:   Load up on vegetables and fruits - one-half of your plate: Aim for color and variety, and remember that potatoes dont count.   Go for whole grains - one-quarter of your plate: Whole wheat, barley, wheat berries, quinoa, oats, brown rice, and foods made with them. If you want pasta, go with whole wheat pasta.   Protein power - one-quarter of your plate: Fish, chicken, beans, and nuts are all  healthy, versatile protein sources. Limit red meat.   The diet, however, does go beyond the plate, offering a few other suggestions.   Use healthy plant oils, such as olive, canola, soy, corn, sunflower and peanut. Check the labels, and avoid partially hydrogenated oil, which have unhealthy trans fats.   If youre thirsty, drink water. Coffee and tea are good in moderation, but skip sugary drinks and limit milk and dairy products to one or two daily servings.   The type of carbohydrate in the diet is more important than the amount. Some sources of carbohydrates, such as vegetables, fruits, whole grains, and beans-are healthier than others.   Finally, stay active  Signed, Thomasene RippleKardie Muna Demers, DO  10/26/2020 4:25 PM     [facility]

## 2020-10-26 NOTE — Patient Instructions (Signed)
Medication Instructions:  Your physician recommends that you continue on your current medications as directed. Please refer to the Current Medication list given to you today.  *If you need a refill on your cardiac medications before your next appointment, please call your pharmacy*   Lab Work: None ordered   If you have labs (blood work) drawn today and your tests are completely normal, you will receive your results only by: . MyChart Message (if you have MyChart) OR . A paper copy in the mail If you have any lab test that is abnormal or we need to change your treatment, we will call you to review the results.   Testing/Procedures: None ordered    Follow-Up: At [facility], you and your health needs are our priority.  As part of our continuing mission to provide you with exceptional heart care, we have created designated Provider Care Teams.  These Care Teams include your primary Cardiologist (physician) and Advanced Practice Providers (APPs -  Physician Assistants and Nurse Practitioners) who all work together to provide you with the care you need, when you need it.  We recommend signing up for the patient portal called "MyChart".  Sign up information is provided on this After Visit Summary.  MyChart is used to connect with patients for Virtual Visits (Telemedicine).  Patients are able to view lab/test results, encounter notes, upcoming appointments, etc.  Non-urgent messages can be sent to your provider as well.   To learn more about what you can do with MyChart, go to [email or web address].    Your next appointment:   6 month(s)  The format for your next appointment:   In Person  Provider:   Kardie Tobb, DO   Other Instructions None   

## 2020-10-26 NOTE — Telephone Encounter (Signed)
Prescription refill request for Eliquis received. Indication: a fib Last office visit: 10/26/20 Scr: 1.0 Age: 59 Weight: 174#

## 2020-12-25 ENCOUNTER — Telehealth: Payer: Self-pay | Admitting: Gastroenterology

## 2020-12-25 NOTE — Telephone Encounter (Signed)
Absolutely Can you please make appointment in APP clinic [facility]

## 2020-12-25 NOTE — Telephone Encounter (Signed)
Hi Dr. Chales Abrahams, this pt used to see you in [place]. Pt then moved so switched his GI care to [facility]. Pt recently moved back and would like to transfer back to see you. He has been experiencing severe nausea on and off along with abd pain. However, it has gotten worse lately. Records from [facility] are in [place]. Pls advise if you would take pt back. Thank you.

## 2020-12-26 ENCOUNTER — Encounter: Payer: Self-pay | Admitting: Gastroenterology

## 2020-12-26 NOTE — Telephone Encounter (Signed)
Ov scheduled on 01/16/21 at 10:20am.

## 2021-01-16 ENCOUNTER — Encounter: Payer: Self-pay | Admitting: Gastroenterology

## 2021-01-16 ENCOUNTER — Ambulatory Visit: Payer: Medicare Other | Admitting: Gastroenterology

## 2021-01-16 ENCOUNTER — Other Ambulatory Visit (INDEPENDENT_AMBULATORY_CARE_PROVIDER_SITE_OTHER): Payer: Medicare Other

## 2021-01-16 ENCOUNTER — Telehealth: Payer: Self-pay

## 2021-01-16 ENCOUNTER — Other Ambulatory Visit: Payer: Self-pay

## 2021-01-16 VITALS — BP 130/80 | HR 74 | Ht 69.0 in | Wt 177.1 lb

## 2021-01-16 DIAGNOSIS — R1319 Other dysphagia: Secondary | ICD-10-CM

## 2021-01-16 DIAGNOSIS — R112 Nausea with vomiting, unspecified: Secondary | ICD-10-CM | POA: Diagnosis not present

## 2021-01-16 DIAGNOSIS — R1012 Left upper quadrant pain: Secondary | ICD-10-CM | POA: Diagnosis not present

## 2021-01-16 LAB — COMPREHENSIVE METABOLIC PANEL
ALT: 3 U/L (ref 0–53)
AST: 8 U/L (ref 0–37)
Albumin: 4.4 g/dL (ref 3.5–5.2)
Alkaline Phosphatase: 47 U/L (ref 39–117)
BUN: 23 mg/dL (ref 6–23)
CO2: 32 mEq/L (ref 19–32)
Calcium: 9.9 mg/dL (ref 8.4–10.5)
Chloride: 98 mEq/L (ref 96–112)
Creatinine, Ser: 1.23 mg/dL (ref 0.40–1.50)
GFR: 63.45 mL/min (ref >60.00)
Glucose, Bld: 208 mg/dL — ABNORMAL HIGH (ref 70–99)
Potassium: 4.6 mEq/L (ref 3.5–5.1)
Sodium: 136 mEq/L (ref 135–145)
Total Bilirubin: 0.3 mg/dL (ref 0.2–1.2)
Total Protein: 6.7 g/dL (ref 6.0–8.3)

## 2021-01-16 LAB — CBC WITH DIFFERENTIAL/PLATELET
Basophils Absolute: 0 10*3/uL (ref 0.0–0.1)
Basophils Relative: 0.5 % (ref 0.0–3.0)
Eosinophils Absolute: 0.1 10*3/uL (ref 0.0–0.7)
Eosinophils Relative: 1.5 % (ref 0.0–5.0)
HCT: 49.8 % (ref 39.0–52.0)
Hemoglobin: 16.7 g/dL (ref 13.0–17.0)
Lymphocytes Relative: 27.6 % (ref 12.0–46.0)
Lymphs Abs: 2.5 10*3/uL (ref 0.7–4.0)
MCHC: 33.5 g/dL (ref 30.0–36.0)
MCV: 87.3 fl (ref 78.0–100.0)
Monocytes Absolute: 0.5 10*3/uL (ref 0.1–1.0)
Monocytes Relative: 5.2 % (ref 3.0–12.0)
Neutro Abs: 5.8 10*3/uL (ref 1.4–7.7)
Neutrophils Relative %: 65.2 % (ref 43.0–77.0)
Platelets: 273 10*3/uL (ref 150.0–400.0)
RBC: 5.71 Mil/uL (ref 4.22–5.81)
RDW: 13.1 % (ref 11.5–15.5)
WBC: 8.9 10*3/uL (ref 4.0–10.5)

## 2021-01-16 LAB — LIPASE: Lipase: 14 U/L (ref 11.0–59.0)

## 2021-01-16 NOTE — Telephone Encounter (Signed)
   Primary Cardiologist: Thomasene Ripple, DO  Chart reviewed and patient contacted today by phone  as part of pre-operative protocol coverage. Given past medical history and time since last visit, based on ACC/AHA guidelines, Charles Huynh would be at acceptable risk for the planned procedure without further cardiovascular testing.   Per Dr Servando Salina- [place] to hold Eliquis for 4 doses pre op and resume as soon as safe post op, ideally the evening of the procedure. The patient is aware of these instructions.   The patient was advised that if he develops new symptoms prior to surgery to contact our office to arrange for a follow-up visit, and he verbalized understanding.  I will route this recommendation to the requesting party via Epic fax function and remove from pre-op pool.  Please call with questions.  Corine Shelter, PA-C 01/16/2021, 1:26 PM

## 2021-01-16 NOTE — Telephone Encounter (Signed)
Pharmacy please comment on anticoagulation and then we will contact the patient for clearance.  Corine Shelter PA-C 01/16/2021 11:44 AM

## 2021-01-16 NOTE — Telephone Encounter (Signed)
Patient with diagnosis of A Fib on Eliquis for anticoagulation.    Procedure: EGD Date of procedure: 01/29/21   CHA2DS2-VASc Score = 5  This indicates a 7.2% annual risk of stroke. The patient's score is based upon: CHF History: No HTN History: Yes Diabetes History: Yes Stroke History: Yes (CVA and PE) Vascular Disease History: Yes Age Score: 0 Gender Score: 0    CrCl 65mL/min Platelet count 259K  Patient with history of CVA and PE. Will route to Dr. Servando Salina for input on holding Eliquis.

## 2021-01-16 NOTE — Progress Notes (Signed)
Chief Complaint: Abdominal problems  Referring Provider:  No ref. provider found      ASSESSMENT AND PLAN;   #1. LUQ pain  #2. N/V d/t ?gastroparesis.  Narcotics may be contributing.  #3. GERD with Eso dysphagia. H/O Barrett's esophagus per patient.  #4. Multiple comorbid conditions including CAD, HTN, PAF on Eliquis, Nl EF 60-65%, DM2 with neuropathy, HLD, anxiety/depression, CVA, Parkinson's, H/O PE (on Eliquis), chronic smoker, chronic back pain requiring narcotics.  Plan: -Continue Nexium 40mg  po qd -CT AP with PO and IV contrast -CBC, CMP, lipase today -Ba swallow with Ba tab -EGD with possible dil/Bx off eliquis x 24 hrs after cardio clearence.  I believe he meets [facility] criteria. -Continue reglan 10 mg p.o. twice daily for now. -If still with problems, consider solid-phase GES -FU in 6 months   HPI:    Charles Huynh is a 60 y.o. male  With CAD, HTN, PAF on Eliquis, Nl EF 60-65%, DM2 with neuropathy, HLD, anxiety/depression, CVA, Parkinson's, H/O PE (on Eliquis),COPD with continued smoking (not on O2), chronic back pain requiring narcotics  No GI notes are available.  With multiple GI complaints   -Dysphagia: Lower neck/upper chest, mostly to solids, more prominent after neck Sx 2004 and after he has been diagnosed with Parkinson's.  "Chokes very frequently".  Has Barrett's per patient.  He was told when he had last EGD in 2019 in [place].  He also had esophageal dilatation at that time.  He denies having any heartburn while on Nexium 40 mg p.o. once a day.   -Longstanding N/V d/t gastroparesis per patient.  He does not remember if he ever had gastric emptying scan.  He has been on Reglan 10 mg p.o. twice daily over several years.  No tremors.  Does not want to go off.  Of note that he does have history of Parkinson's.  If he misses a dose of Reglan, he starts having nausea/vomiting.  No hematemesis.  No melena or hematochezia.  -Lately has been having left  upper quadrant abdominal pain with some bloating, more prominently after eating. Has longstanding history of constipation.  Lately his stool has been soft.  He has been taking high-fiber diet.  He has been drinking plenty of water.  No significant weight loss recently.  No fever chills or night sweats.     Previous GI work-up:  EGD 2019 Dr Philis Kendall at [facility]): neg per patient except Barrett's, s/p dil  Colon 2019 -Dr. Philis Kendall at [facility]- small polyp - rpt in 84yrs  CTA 04/2020 IMPRESSION: Vascular 1. No evidence of acute aortic syndrome. 2. No central or lobar pulmonary artery filling defects are identified. More distal evaluation limited by respiratory motion artifact. 3. Borderline enlarged central pulmonary arteries with a PA/aorta ratio of 1.05. Findings can be seen with pulmonary arterial hypertension. 4. Reflux of contrast material into the hepatic veins and IVC, can be seen with elevated right heart pressures/right heart failure. 5. Aortic Atherosclerosis (ICD10-I70.0). 6. Severe narrowing of the IMA origin. 7. Coronary artery atherosclerosis.  Nonvascular 1. Emphysema (ICD10-J43.9). Mild bronchitic changes and air trapping as well. 2. Punctate nonobstructing right renal calculus. 3. Mild prostatomegaly. 4. Penile prosthesis with reservoir in the right lower quadrant and intact catheter tubing traversing right inguinal canal. 5. Spinal nerve stimulator entering at the T2 level with intact leads.  SH-known to me from [place], divorced but they are again together, history of smoking, disabled, 3 boys. Past Medical History:  Diagnosis  Date  . Anxiety   . Atrial fibrillation (HCC)   . Barrett esophagus   . Cerebrovascular accident (HCC) 07/09/2017  . Chronic pain syndrome 07/09/2017  . Coronary artery disease of native artery of native heart with stable angina pectoris (HCC) 01/03/2020  . 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 Depression    . Diabetes (HCC)   . Essential hypertension 08/30/2019  . Gastroesophageal reflux disease without esophagitis 09/02/2017  . Heart disease   . History of ulcer disease   . Hx pulmonary embolism   . Hyperlipidemia   . Hypertension   . Hypothyroidism 07/09/2017  . Mixed hyperlipidemia 01/03/2020  . PAF (paroxysmal atrial fibrillation) (HCC) 01/03/2020  . Parkinson's disease (HCC)   . Stroke (HCC)   . Thyroid disease   . Type 2 diabetes mellitus without complication (HCC) 07/09/2017    Past Surgical History:  Procedure Laterality Date  . BACK SURGERY    . CARDIAC CATHETERIZATION    . COLONOSCOPY  2019   have a small polyp [place] Dr Philis Kendall  . ESOPHAGOGASTRODUODENOSCOPY  2019   Dr Philis Kendall. [place]  . KNEE ARTHROSCOPY Left   . NECK SURGERY    . SPINAL CORD STIMULATOR INSERTION    . WRIST SURGERY Bilateral     Family History  Problem Relation Age of Onset  . Lung cancer Mother   . Heart attack Father        At age 64.  . Emphysema Father   . Heart disease Father   . Colon cancer Neg Hx   . Esophageal cancer Neg Hx     Social History   Tobacco Use  . Smoking status: Current Every Day Smoker    Packs/day: 1.00    Years: 47.00    Pack years: 47.00    Types: Cigarettes  . Smokeless tobacco: Never Used  Vaping Use  . Vaping Use: Never used  Substance Use Topics  . Alcohol use: Not Currently    Comment: very seldom; 1 -2 times per year  . Drug use: Never    Current Outpatient Medications  Medication Sig Dispense Refill  . albuterol (VENTOLIN HFA) 108 (90 Base) MCG/ACT inhaler Inhale 2 puffs into the lungs every 4 (four) hours as needed.    Marland Kitchen aspirin EC 81 MG tablet Take 1 tablet (81 mg total) by mouth daily. 90 tablet 3  . baclofen (LIORESAL) 10 MG tablet TK 1 T PO BID    . carbidopa-levodopa (SINEMET CR) 50-200 MG tablet TK 2 TS PO BID    . citalopram (CELEXA) 40 MG tablet TK 1 T PO QD    . DILT-XR 120 MG 24 hr capsule TK 1 C PO QD    .  diphenhydrAMINE (BENADRYL) 25 mg capsule Take 25 mg by mouth at bedtime.    Marland Kitchen ELIQUIS 5 MG TABS tablet Take 1 tablet (5 mg total) by mouth 2 (two) times daily. 60 tablet 4  . esomeprazole (NEXIUM) 40 MG capsule Take 40 mg by mouth daily at 12 noon.    . fenofibrate 160 MG tablet TK 1 T PO QD    . fentaNYL (DURAGESIC) 50 MCG/HR 1 patch as directed. Apply 1 patch every 2.5 days    . glipiZIDE (GLUCOTROL XL) 5 MG 24 hr tablet Take 5 mg by mouth daily.    . hydrochlorothiazide (MICROZIDE) 12.5 MG capsule Take 1 capsule (12.5 mg total) by mouth daily. 90 capsule 3  . isosorbide mononitrate (IMDUR) 60 MG 24 hr tablet  Take 1 tablet (60 mg total) by mouth daily. 90 tablet 3  . JANUVIA 100 MG tablet Take 100 mg by mouth daily.    Marland Kitchen JARDIANCE 25 MG TABS tablet Take 25 mg by mouth daily.    Marland Kitchen levothyroxine (SYNTHROID) 50 MCG tablet TK 1 T PO QD    . lovastatin (MEVACOR) 20 MG tablet Take 1 tablet by mouth daily.    . metFORMIN (GLUCOPHAGE) 500 MG tablet TAKE 2 TABLETS PO BID    . metFORMIN (GLUCOPHAGE-XR) 500 MG 24 hr tablet Take 1,000 mg by mouth 2 (two) times daily.    . metoCLOPramide (REGLAN) 10 MG tablet TK 1 T PO BID    . metoprolol tartrate (LOPRESSOR) 100 MG tablet Take 1 tablet (100 mg total) by mouth 2 (two) times daily. 180 tablet 3  . nitroGLYCERIN (NITROSTAT) 0.4 MG SL tablet PLACE 1 TABLET UNDER THE TONGUE EVERY 5 MINUTES AS NEEDED FOR CHEST PAIN 25 tablet 6  . NUPLAZID 34 MG CAPS Take 1 capsule by mouth daily.    Marland Kitchen oxyCODONE-acetaminophen (PERCOCET) 10-325 MG tablet Take 1 tablet by mouth 4 (four) times daily.     . potassium chloride (KLOR-CON) 10 MEQ tablet Take 1 tablet by mouth daily 90 tablet 3  . pregabalin (LYRICA) 300 MG capsule TK ONE C PO Q 12 H    . promethazine (PHENERGAN) 25 MG tablet Take 25 mg by mouth every 6 (six) hours as needed for nausea or vomiting.    Marland Kitchen QUEtiapine (SEROQUEL) 50 MG tablet Take 50 mg by mouth at bedtime.    Marland Kitchen testosterone cypionate (DEPOTESTOSTERONE  CYPIONATE) 200 MG/ML injection Inject 200 mg into the muscle every 14 (fourteen) days.    Marland Kitchen triamcinolone cream (KENALOG) 0.1 % in the morning and at bedtime.    Marland Kitchen TRIAZOLAM PO Take 1 tablet by mouth at bedtime.     No current facility-administered medications for this visit.    Allergies  Allergen Reactions  . Naproxen Sodium Shortness Of Breath and Anaphylaxis    Review of Systems:  Constitutional: Denies fever, chills, diaphoresis, appetite change and has fatigue.  HEENT: Denies photophobia, eye pain, redness, hearing loss, ear pain, congestion, sore throat, rhinorrhea, sneezing, mouth sores, neck pain, neck stiffness and tinnitus.   Respiratory: Denies SOB, DOE, cough, chest tightness,  and wheezing.   Cardiovascular: Denies chest pain, palpitations and leg swelling.  Genitourinary: Denies dysuria, urgency, frequency, hematuria, flank pain and difficulty urinating.  Musculoskeletal: Has myalgias, back pain, joint swelling, arthralgias and gait problem.  Skin: No rash.  Neurological: Denies dizziness, seizures, syncope, weakness, light-headedness, numbness and headaches.  Hematological: Denies adenopathy. Easy bruising, personal or family bleeding history  Psychiatric/Behavioral: Has anxiety or depression     Physical Exam:    BP 130/80   Pulse 74   Ht 5\' 9"  (1.753 m)   Wt 177 lb 2 oz (80.3 kg)   BMI 26.16 kg/m  Wt Readings from Last 3 Encounters:  01/16/21 177 lb 2 oz (80.3 kg)  10/26/20 174 lb 3.2 oz (79 kg)  03/28/20 168 lb (76.2 kg)   Constitutional:  Well-developed, in no acute distress. Psychiatric: Normal mood and affect. Behavior is normal. HEENT: Pupils normal.  Conjunctivae are normal. No scleral icterus. Neck supple.  Cardiovascular: Normal rate, regular rhythm. No edema Pulmonary/chest: Effort normal and decreased bilateral breath sounds. No wheezing, rales or rhonchi. Abdominal: Soft, nondistended. Nontender. Bowel sounds active throughout. There are no  masses palpable. No hepatomegaly. Rectal: Deferred Neurological:  Alert and oriented to person place and time. Skin: Skin is warm and dry. No rashes noted.  Data Reviewed: I have personally reviewed following labs and imaging studies  CBC: No flowsheet data found.  CMP: CMP Latest Ref Rng & Units 10/10/2019  Creatinine 0.61 - 1.24 mg/dL 5.91      Edman Circle, MD 01/16/2021, 10:48 AM  Cc: No ref. provider found

## 2021-01-16 NOTE — Telephone Encounter (Signed)
Okay to have the patient hold his Eliquis for 4 separate doses before his EGD.

## 2021-01-16 NOTE — Patient Instructions (Addendum)
If you are age 62 or older, your body mass index should be between 23-30. Your Body mass index is 26.16 kg/m. If this is out of the aforementioned range listed, please consider follow up with your Primary Care Provider.  If you are age 40 or younger, your body mass index should be between 19-25. Your Body mass index is 26.16 kg/m. If this is out of the aformentioned range listed, please consider follow up with your Primary Care Provider.   You have been [facility] CT [facility] [place] [address] [address] Radiology).   You are scheduled on 01/21/21 at [facility] should arrive 15 minutes prior to your appointment time for registration. Please follow the written instructions below on the day of your exam:  WARNING: IF YOU ARE ALLERGIC TO IODINE/X-RAY DYE, PLEASE NOTIFY RADIOLOGY IMMEDIATELY AT [phone]! YOU WILL BE GIVEN A 13 HOUR PREMEDICATION PREP.  1) Do not eat or drink anything after 5am (4 hours prior to your test) 2) You have been given 2 bottles of oral contrast to drink. The solution may taste better if refrigerated, but do NOT add ice or any other liquid to this solution. Shake well before drinking.    Drink 1 bottle of contrast @ 7am (2 hours prior to your exam)  Drink 1 bottle of contrast @ 8am (1 hour prior to your exam)  You may take any medications as prescribed with a small amount of water, if necessary. If you take any of the following medications: METFORMIN, GLUCOPHAGE, GLUCOVANCE, AVANDAMET, RIOMET, FORTAMET, Stokes MET, JANUMET, GLUMETZA or METAGLIP, you MAY be asked to HOLD this medication 48 hours AFTER the exam.  The purpose of you drinking the oral contrast is to aid in the visualization of your intestinal tract. The contrast solution may cause some diarrhea. Depending on your individual set of symptoms, you may also receive an intravenous injection of x-ray contrast/dye. Plan on being at [facility] for 30 minutes or longer, depending on the type of exam you are having performed.  This test typically takes 30-45 minutes to complete.  If you have any questions regarding your exam or if you need to reschedule, you may call the CT department at [phone] between the hours of 8:00 am and 5:00 pm, Monday-Friday.  ________________________________________________________________________  Charles Huynh have been scheduled for a Barium Esophogram at [facility] Rad[facility] (1st floor of the hospital) on 01/24/21 at 9:30am. Please arrive 15 minutes prior to your appointment for registration. Make certain not to have anything to eat or drink 3 hours prior to your test. If you need to reschedule for any reason, please contact radiology at [phone] to do so. __________________________________________________________________ A barium swallow is an examination that concentrates on views of the esophagus. This tends to be a double contrast exam (barium and two liquids which, when combined, create a gas to distend the wall of the oesophagus) or single contrast (non-ionic iodine based). The study is usually tailored to your symptoms so a good history is essential. Attention is paid during the study to the form, structure and configuration of the esophagus, looking for functional disorders (such as aspiration, dysphagia, achalasia, motility and reflux) EXAMINATION You may be asked to change into a gown, depending on the type of swallow being performed. A radiologist and radiographer will perform the procedure. The radiologist will advise you of the type of contrast selected for your procedure and direct you  during the exam. You will be asked to stand, sit or lie in several different positions and to hold a small amount of fluid in your mouth before being asked to swallow while the imaging is performed .In some instances you may be asked to swallow barium coated marshmallows to assess the motility of a solid food  bolus. The exam can be recorded as a digital or video fluoroscopy procedure. POST PROCEDURE It will take 1-2 days for the barium to pass through your system. To facilitate this, it is important, unless otherwise directed, to increase your fluids for the next 24-48hrs and to resume your normal diet.  This test typically takes about 30 minutes to perform. __________________________________________________________________________________  Please go to the lab on the 2nd floor [address] before you leave the office today.   You will be contacted by our office prior to your procedure for directions on holding your Eliquis.  If you do not hear from our office 1 week prior to your scheduled procedure, please call [phone] to discuss.  Continue Nexium and Reglan  Thank you,  Dr. Jackquline Denmark

## 2021-01-16 NOTE — Telephone Encounter (Signed)
[facility] Pre-operative Risk Assessment     Request for surgical clearance:     Endoscopy Procedure  What type of surgery is being performed?     EGD  When is this surgery scheduled?     01/29/21  What type of clearance is required ?   Pharmacy  Are there any medications that need to be held prior to surgery and how long? Eliquis x24 hours   Practice name and name of physician performing surgery?      [facility]  What is your office phone and fax number?      Phone- [phone]  [phone]  Anesthesia type (None, local, MAC, general) ?       MAC

## 2021-01-17 ENCOUNTER — Encounter: Payer: Self-pay | Admitting: Gastroenterology

## 2021-01-21 ENCOUNTER — Ambulatory Visit (HOSPITAL_BASED_OUTPATIENT_CLINIC_OR_DEPARTMENT_OTHER): Payer: Medicare Other

## 2021-01-22 ENCOUNTER — Telehealth: Payer: Self-pay | Admitting: Gastroenterology

## 2021-01-22 NOTE — Telephone Encounter (Signed)
I have returned patient's call.

## 2021-01-24 ENCOUNTER — Other Ambulatory Visit: Payer: Self-pay

## 2021-01-24 ENCOUNTER — Ambulatory Visit (HOSPITAL_COMMUNITY)
Admission: RE | Admit: 2021-01-24 | Discharge: 2021-01-24 | Disposition: A | Discharge status: Home / Self Care | Payer: Medicare Other | Source: Ambulatory Visit | Attending: Gastroenterology | Admitting: Gastroenterology

## 2021-01-24 DIAGNOSIS — F330 Major depressive disorder, recurrent, mild: Secondary | ICD-10-CM | POA: Insufficient documentation

## 2021-01-24 DIAGNOSIS — R1012 Left upper quadrant pain: Secondary | ICD-10-CM | POA: Insufficient documentation

## 2021-01-24 DIAGNOSIS — R1319 Other dysphagia: Secondary | ICD-10-CM | POA: Insufficient documentation

## 2021-01-24 DIAGNOSIS — R112 Nausea with vomiting, unspecified: Secondary | ICD-10-CM | POA: Diagnosis present

## 2021-01-24 DIAGNOSIS — K3184 Gastroparesis: Secondary | ICD-10-CM

## 2021-01-24 HISTORY — DX: Gastroparesis: K31.84

## 2021-01-24 HISTORY — DX: Major depressive disorder, recurrent, mild: F33.0

## 2021-01-25 ENCOUNTER — Ambulatory Visit (HOSPITAL_BASED_OUTPATIENT_CLINIC_OR_DEPARTMENT_OTHER)
Admission: RE | Admit: 2021-01-25 | Discharge: 2021-01-25 | Disposition: A | Discharge status: Home / Self Care | Payer: Medicare Other | Source: Ambulatory Visit | Attending: Gastroenterology | Admitting: Gastroenterology

## 2021-01-25 ENCOUNTER — Telehealth: Payer: Self-pay | Admitting: Gastroenterology

## 2021-01-25 DIAGNOSIS — R1319 Other dysphagia: Secondary | ICD-10-CM

## 2021-01-25 DIAGNOSIS — R1012 Left upper quadrant pain: Secondary | ICD-10-CM

## 2021-01-25 DIAGNOSIS — R112 Nausea with vomiting, unspecified: Secondary | ICD-10-CM

## 2021-01-29 ENCOUNTER — Encounter: Payer: Self-pay | Admitting: Gastroenterology

## 2021-01-29 ENCOUNTER — Ambulatory Visit (AMBULATORY_SURGERY_CENTER): Payer: Medicare Other | Admitting: Gastroenterology

## 2021-01-29 ENCOUNTER — Other Ambulatory Visit: Payer: Self-pay

## 2021-01-29 VITALS — BP 147/68 | HR 58 | Temp 97.1°F | Resp 12 | Ht 69.0 in | Wt 177.0 lb

## 2021-01-29 DIAGNOSIS — R1310 Dysphagia, unspecified: Secondary | ICD-10-CM

## 2021-01-29 DIAGNOSIS — K2270 Barrett's esophagus without dysplasia: Secondary | ICD-10-CM

## 2021-01-29 DIAGNOSIS — K2970 Gastritis, unspecified, without bleeding: Secondary | ICD-10-CM | POA: Diagnosis not present

## 2021-01-29 DIAGNOSIS — K449 Diaphragmatic hernia without obstruction or gangrene: Secondary | ICD-10-CM | POA: Diagnosis not present

## 2021-01-29 DIAGNOSIS — R1012 Left upper quadrant pain: Secondary | ICD-10-CM

## 2021-01-29 DIAGNOSIS — K219 Gastro-esophageal reflux disease without esophagitis: Secondary | ICD-10-CM | POA: Diagnosis not present

## 2021-01-29 DIAGNOSIS — K3189 Other diseases of stomach and duodenum: Secondary | ICD-10-CM | POA: Diagnosis not present

## 2021-01-29 MED ORDER — SODIUM CHLORIDE 0.9 % IV SOLN: 500.0000 mL | Freq: Once | INTRAVENOUS | Status: DC

## 2021-01-29 NOTE — Progress Notes (Signed)
Dr. Chales Abrahams aware of patients previous chest pain, he is in discussing with patient

## 2021-01-29 NOTE — Progress Notes (Signed)
A and O x3. Report to RN. Tolerated MAC anesthesia well.Teeth unchanged after procedure.

## 2021-01-29 NOTE — Patient Instructions (Signed)
Handouts provided:  Gastritis, Hiatal Hernia and Post-Dilation diet  RESUME Eliquis at prior dose tomorrow.  YOU HAD AN ENDOSCOPIC PROCEDURE TODAY AT THE [facility]:   Refer to the procedure report that was given to you for any specific questions about what was found during the examination.  If the procedure report does not answer your questions, please call your gastroenterologist to clarify.  If you requested that your care partner not be given the details of your procedure findings, then the procedure report has been included in a sealed envelope for you to review at your convenience later.  YOU SHOULD EXPECT: Some feelings of bloating in the abdomen. Passage of more gas than usual.  Walking can help get rid of the air that was put into your GI tract during the procedure and reduce the bloating. If you had a lower endoscopy (such as a colonoscopy or flexible sigmoidoscopy) you may notice spotting of blood in your stool or on the toilet paper. If you underwent a bowel prep for your procedure, you may not have a normal bowel movement for a few days.  Please Note:  You might notice some irritation and congestion in your nose or some drainage.  This is from the oxygen used during your procedure.  There is no need for concern and it should clear up in a day or so.  SYMPTOMS TO REPORT IMMEDIATELY:   Following upper endoscopy (EGD)  Vomiting of blood or coffee ground material  New chest pain or pain under the shoulder blades  Painful or persistently difficult swallowing  New shortness of breath  Fever of 100F or higher  Black, tarry-looking stools  For urgent or emergent issues, a gastroenterologist can be reached at any hour by calling [phone]. Do not use MyChart messaging for urgent concerns.    DIET:  See Post-Dilation Diet provided.  Drink plenty of fluids but you should avoid alcoholic beverages for 24 hours.  ACTIVITY:  You should plan to take it easy for the rest  of today and you should NOT DRIVE or use heavy machinery until tomorrow (because of the sedation medicines used during the test).    FOLLOW UP: Our staff will call the number listed on your records 48-72 hours following your procedure to check on you and address any questions or concerns that you may have regarding the information given to you following your procedure. If we do not reach you, we will leave a message.  We will attempt to reach you two times.  During this call, we will ask if you have developed any symptoms of COVID 19. If you develop any symptoms (ie: fever, flu-like symptoms, shortness of breath, cough etc.) before then, please call [phone].  If you test positive for Covid 19 in the 2 weeks post procedure, please call and report this information to [place].    If any biopsies were taken you will be contacted by phone or by letter within the next 1-3 weeks.  Please call us at [phone] if you have not heard about the biopsies in 3 weeks.    SIGNATURES/CONFIDENTIALITY: You and/or your care partner have signed paperwork which will be entered into your electronic medical record.  These signatures attest to the fact that that the information above on your After Visit Summary has been reviewed and is understood.  Full responsibility of the confidentiality of this discharge information lies with you and/or your care-partner.

## 2021-01-29 NOTE — Op Note (Signed)
[facility] Patient Name: Charles Huynh Procedure Date: 01/29/2021 1:35 PM MRN: [id number] Endoscopist: Lynann Bologna , MD Age: 60 Referring MD:  Date of Birth: [birth year] Gender: Male Account #: [id number] Procedure:                Upper GI endoscopy Indications:              Dysphagia. H/O ? Barrett's esophagus on EGD in                            [place]. Intermittent nausea/vomiting. Medicines:                Monitored Anesthesia Care Procedure:                Pre-Anesthesia Assessment:                           - Prior to the procedure, a History and Physical                            was performed, and patient medications and                            allergies were reviewed. The patient's tolerance of                            previous anesthesia was also reviewed. The risks                            and benefits of the procedure and the sedation                            options and risks were discussed with the patient.                            All questions were answered, and informed consent                            was obtained. Prior Anticoagulants: The patient has                            taken Eliquis (apixaban), last dose was 1 day prior                            to procedure. ASA Grade Assessment: III - A patient                            with severe systemic disease. After reviewing the                            risks and benefits, the patient was deemed in                            satisfactory condition to undergo the procedure.  After obtaining informed consent, the endoscope was                            passed under direct vision. Throughout the                            procedure, the patient's blood pressure, pulse, and                            oxygen saturations were monitored continuously. The                            Endoscope was introduced through the mouth, and                             advanced to the second part of duodenum. The upper                            GI endoscopy was accomplished without difficulty.                            The patient tolerated the procedure well. Scope In: Scope Out: Findings:                 2 small inlet patches were noted in the proximal                            esophagus. The esophagus was otherwise                            unremarkable. Multiple biopsies were obtained from                            proximal and midesophagus to rule out eosinophilic                            esophagitis. The Z-line was irregular with few                            small islands of squamous mucosa projecting just                            above the GE junction at 40 cm. Multiple biopsies                            were obtained directed by NBI. Due to presence of                            dysphagia, it was elected to dilate the esophagus.                            Dilation was performed with a Maloney dilator with  mild resistance at 50 Fr and 52 Fr. Biopsies were                            taken with a cold forceps for histology.                           A small transient hiatal hernia was present.                           Diffuse mild inflammation characterized by erythema                            was found in the gastric body and in the gastric                            antrum. Biopsies were taken with a cold forceps for                            histology.                           The examined duodenum was normal. Biopsies for                            histology were taken with a cold forceps for                            evaluation of celiac disease. Complications:            No immediate complications. Estimated Blood Loss:     Estimated blood loss: none. Impression:               - Z-line irregular, 40 cm from the incisors.                            Biopsied to r/o short segment Barrett's.                            - Small hiatal hernia.                           - Gastritis. Biopsied.                           - S/P esophageal dilatation. Recommendation:           - Patient has a contact number available for                            emergencies. The signs and symptoms of potential                            delayed complications were discussed with the                            patient. Return to normal activities tomorrow.  Written discharge instructions were provided to the                            patient.                           - Post dilatation diet.                           - Continue present medications.                           - Await pathology results.                           - Resume Eliquis (apixaban) at prior dose tomorrow.                           - The findings and recommendations were discussed                            with the patient's family.                           - FU in GI clinic in 12 weeks with me/APP clinic. Lynann Bologna, MD 01/29/2021 1:59:48 PM This report has been signed electronically.

## 2021-01-29 NOTE — Progress Notes (Signed)
Notified Christiana Fuchs CRNA and Dr. Chales Abrahams of patient having chest pain last Wednesday 01/23/21 and having to take Nitrogylcerin. Waiting on response.

## 2021-01-29 NOTE — Progress Notes (Signed)
Called to room to assist during endoscopic procedure.  Patient ID and intended procedure confirmed with present staff. Received instructions for my participation in the procedure from the performing physician.  

## 2021-01-30 ENCOUNTER — Encounter (HOSPITAL_BASED_OUTPATIENT_CLINIC_OR_DEPARTMENT_OTHER): Payer: Self-pay

## 2021-01-30 ENCOUNTER — Ambulatory Visit (HOSPITAL_BASED_OUTPATIENT_CLINIC_OR_DEPARTMENT_OTHER): Payer: Medicare Other

## 2021-01-31 ENCOUNTER — Telehealth: Payer: Self-pay

## 2021-01-31 NOTE — Telephone Encounter (Signed)
  Follow up Call-  Call back number 01/29/2021  Post procedure Call Back phone  # [phone]  Permission to leave phone message Yes  Some recent data might be hidden     Patient questions:  Do you have a fever, pain , or abdominal swelling? No. Pain Score  0 *  Have you tolerated food without any problems? Yes.    Have you been able to return to your normal activities? Yes.    Do you have any questions about your discharge instructions: Diet   No. Medications  No. Follow up visit  No.  Do you have questions or concerns about your Care? No.  Actions: * If pain score is 4 or above: No action needed, pain <4. 1. Have you developed a fever since your procedure? no  2.   Have you had an respiratory symptoms (SOB or cough) since your procedure? no  3.   Have you tested positive for COVID 19 since your procedure no  4.   Have you had any family members/close contacts diagnosed with the COVID 19 since your procedure?  no   If yes to any of these questions please route to Laverna Peace, RN and Karlton Lemon, RN

## 2021-02-04 ENCOUNTER — Encounter: Payer: Self-pay | Admitting: Gastroenterology

## 2021-02-06 DIAGNOSIS — E291 Testicular hypofunction: Secondary | ICD-10-CM | POA: Insufficient documentation

## 2021-02-06 HISTORY — DX: Testicular hypofunction: E29.1

## 2021-02-13 ENCOUNTER — Other Ambulatory Visit: Payer: Self-pay

## 2021-02-13 ENCOUNTER — Ambulatory Visit (HOSPITAL_BASED_OUTPATIENT_CLINIC_OR_DEPARTMENT_OTHER)
Admission: RE | Admit: 2021-02-13 | Discharge: 2021-02-13 | Disposition: A | Discharge status: Home / Self Care | Payer: Medicare Other | Source: Ambulatory Visit | Attending: Gastroenterology | Admitting: Gastroenterology

## 2021-02-13 ENCOUNTER — Encounter (HOSPITAL_BASED_OUTPATIENT_CLINIC_OR_DEPARTMENT_OTHER): Payer: Self-pay

## 2021-02-13 DIAGNOSIS — R112 Nausea with vomiting, unspecified: Secondary | ICD-10-CM | POA: Insufficient documentation

## 2021-02-13 DIAGNOSIS — R1012 Left upper quadrant pain: Secondary | ICD-10-CM | POA: Diagnosis not present

## 2021-02-13 DIAGNOSIS — R1319 Other dysphagia: Secondary | ICD-10-CM | POA: Diagnosis present

## 2021-02-13 MED ORDER — IOHEXOL 300 MG/ML  SOLN
100.0000 mL | Freq: Once | INTRAMUSCULAR | Status: AC | PRN
  Administered 2021-02-13: 100 mL via INTRAVENOUS

## 2021-02-13 NOTE — Progress Notes (Signed)
Please inform the patient. CT: neg but lot of stool in colon esp L colon Plan: Miralax 17g po bid until large BM, then QD Send report to family physician

## 2021-02-22 ENCOUNTER — Telehealth: Payer: Self-pay | Admitting: Cardiology

## 2021-02-22 NOTE — Telephone Encounter (Signed)
Pt c/o Shortness Of Breath: STAT if SOB developed within the last 24 hours or pt is noticeably SOB on the phone  1. Are you currently SOB (can you hear that pt is SOB on the phone)?  Yes on and off , for the past week and 1/2  2. How long have you been experiencing SOB? This has been going on for about a week and 1/2.  This is not a new within the last 24 hours   3. Are you SOB when sitting or when up moving around? Wife stated it is pretty much all the time.  She state " it is like something just take is breath away"    4. Are you currently experiencing any other symptoms? Wife stated he coughing a lot.      She stated that pt was put on a 24 hr Nitro pill and he has been also been having to take his reg Nitro pill on top of it   Best number [phone]

## 2021-02-22 NOTE — Telephone Encounter (Signed)
Called patient back about his message. Patient complaining about SOB that has lasted for about a week and a half. Patient stated he feels like at times it takes his breath away. Patient stated it feels like he did when he had a PE. Patient stated right now he has an appointment with his PCP this afternoon. Encouraged patient to have his PCP to evaluate him to see if he has a PE. Patient does take eliquis BID. Patient denies any chest pain at this time. Made patient an appointment with Dr. Dulce Sellar, first available, on Monday to get evaluated. Will forward to Dr. Servando Salina for advisement in the mean time. Patient agreed to plan.

## 2021-02-23 NOTE — Progress Notes (Deleted)
Cardiology Office Note:    Date:  02/25/2021   ID:  Charles Huynh, DOB 1961-07-06, MRN [id number]  PCP:  Patient, No Pcp Per  Cardiologist:  Norman Herrlich, MD    Referring MD: No ref. provider found    ASSESSMENT:    1. PAF (paroxysmal atrial fibrillation) (HCC)   2. Chronic anticoagulation   3. Coronary artery disease of native artery of native heart with stable angina pectoris (HCC)   4. Essential hypertension   5. Mixed hyperlipidemia   6. Type 2 diabetes mellitus without complication, without long-term current use of insulin (HCC)    PLAN:    In order of problems listed above:  1. ***   Next appointment: ***   Medication Adjustments/Labs and Tests Ordered: Current medicines are reviewed at length with the patient today.  Concerns regarding medicines are outlined above.  No orders of the defined types were placed in this encounter.  No orders of the defined types were placed in this encounter.   No chief complaint on file.   History of Present Illness:    Charles Huynh is a 60 y.o. male with a hx of paroxysmal atrial fibrillation history of stroke chronic anticoagulation CAD diabetes hyperlipidemia and hypertension.  Last seen ***. Compliance with diet, lifestyle and medications: ***  He has had extensive evaluation including a 7-day event monitor which showed rare atrial and ventricular ectopy and no recurrent episodes of atrial fibrillation.  Echocardiogram performed in November 2020 showed EF 60 to 65% elevated left ventricular and left atrial pressures.  Cardiac CTA 10/12/2019 showed a score of 50 57th percentile moderate stenosis of a large marginal branch and minimal LAD disease.  Recent labs cut-and-paste from [facility] primary care physician: Results for orders placed or performed in visit on 01/23/21 showed normal renal function GFR liver function hemoglobin hematocrit.  His lipids were at target. Comprehensive Metabolic Panel  Result Value  Ref Range  Sodium 140 135 - 146 MMOL/L  Potassium 4.3 3.5 - 5.3 MMOL/L  Chloride 102 98 - 110 MMOL/L  CO2 31 (H) 23 - 30 MMOL/L  BUN 22 8 - 24 MG/DL  Glucose 222 (H) 70 - 99 MG/DL  Creatinine 9.79 8.92 - 1.50 MG/DL  Calcium 9.7 8.5 - 11.9 MG/DL  Total Protein 6.4 6.0 - 8.3 G/DL  Albumin 4.3 3.5 - 5.0 G/DL  Total Bilirubin 0.3 0.1 - 1.2 MG/DL  Alkaline Phosphatase 36 25 - 125 IU/L or U/L  AST (SGOT) 12 5 - 40 IU/L or U/L  ALT (SGPT) 8 5 - 50 IU/L or U/L  Anion Gap 7 4 - 14 MMOL/L  Est. GFR 74 >=60 ML/MIN/1.73 M*2  Lipid Profile  Result Value Ref Range  LDL Direct 77 <130 mg/dL  Total Cholesterol 417 25 - 199 MG/DL  Triglycerides 408 (H) 10 - 150 MG/DL  HDL Cholesterol 39 35 - 135 MG/DL  Total Chol / HDL Cholesterol 3.8 <4.5  Non-HDL Cholesterol 108 MG/DL  Coronary Heart Disease Risk Table  Prot/CRT Urine includes UCRT  Result Value Ref Range  U Interval Random  U Volume 3 ML  U Protein Concentrate 9 MG/DL  U Creatinine Concentrate 62 MG/DL  U Protein/G Creatinine 145.16 0.00 - 200.00 MG/G CREATININE  Hemoglobin A1C  Result Value Ref Range  HEMOGLOBIN A1C 9.6 (H) <5.8 %  eAG 229 MG/DL  eAG Comment  X4G Comment  TSH, 3rd Generation  Result Value Ref Range  TSH 2.610 0.450 - 5.330 UIU/ML  CBC and Differential  Result Value Ref Range  WBC 9.7 4.4 - 11.0 x 10*3/uL  RBC 5.73 4.50 - 5.90 x 10*6/uL  Hemoglobin 16.6 14.0 - 17.5 G/DL  Hematocrit 29.5 28.4 - 50.4 %  MCV 87.2 80.0 - 96.0 FL  MCH 28.9 27.5 - 33.2 PG  MCHC 33.2 33.0 - 37.0 G/DL  RDW 13.2 %  Platelets 268 150 - 450 X 10*3/uL  MPV 8.4 6.8 - 10.2 FL  Neutrophil % 68 %  Lymphocyte % 23 %  Monocyte % 6 %  Eosinophil % 1 %  Basophil % 1 %  Neutrophil Absolute 6.6 1.8 - 7.8 x 10*3/uL  Lymphocyte Absolute 2.2 1.0 - 4.8 x 10*3/uL  Monocyte Absolute 0.6 0.0 - 0.8 x 10*3/uL  Eosinophil Absolute 0.1 0.0 - 0.5 x 10*3/uL  Basophil Absolute 0.1 0.0 - 0.2 x 10*3/uL    Past Medical History:  Diagnosis Date  .  Anxiety   . Atrial fibrillation (HCC)   . Barrett esophagus   . Cerebrovascular accident (HCC) 07/09/2017  . Chronic pain syndrome 07/09/2017  . Clotting disorder (HCC)   . Coronary artery disease of native artery of native heart with stable angina pectoris (HCC) 01/03/2020  . 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 Depression   . Diabetes (HCC)   . Essential hypertension 08/30/2019  . Gastroesophageal reflux disease without esophagitis 09/02/2017  . Heart disease   . History of ulcer disease   . Hx pulmonary embolism   . Hyperlipidemia   . Hypertension   . Hypothyroidism 07/09/2017  . Mixed hyperlipidemia 01/03/2020  . PAF (paroxysmal atrial fibrillation) (HCC) 01/03/2020  . Parkinson's disease (HCC)   . Stroke (HCC)   . Thyroid disease   . Type 2 diabetes mellitus without complication (HCC) 07/09/2017    Past Surgical History:  Procedure Laterality Date  . BACK SURGERY    . CARDIAC CATHETERIZATION    . COLONOSCOPY  2019   have a small polyp [place] [facility] Dr Philis Kendall  . ESOPHAGOGASTRODUODENOSCOPY  2019   Dr Philis Kendall. [place]  . KNEE ARTHROSCOPY Left   . NECK SURGERY    . SPINAL CORD STIMULATOR INSERTION    . WRIST SURGERY Bilateral     Current Medications: No outpatient medications have been marked as taking for the 02/25/21 encounter (Appointment) with Baldo Daub, MD.     Allergies:   Naproxen sodium   Social History   Socioeconomic History  . Marital status: Married    Spouse name: Not on file  . Number of children: Not on file  . Years of education: Not on file  . Highest education level: Not on file  Occupational History  . Not on file  Tobacco Use  . Smoking status: Current Every Day Smoker    Packs/day: 1.00    Years: 47.00    Pack years: 47.00    Types: Cigarettes  . Smokeless tobacco: Never Used  Vaping Use  . Vaping Use: Never used  Substance and Sexual Activity  . Alcohol use: Not Currently    Comment: very seldom; 1 -2 times per year   . Drug use: Never  . Sexual activity: Not on file  Other Topics Concern  . Not on file  Social History Narrative  . Not on file   Social Determinants of Health   Financial Resource Strain: Not on file  Food Insecurity: Not on file  Transportation Needs: Not on file  Physical Activity: Not on file  Stress: Not on  file  Social Connections: Not on file     Family History: The patient's ***family history includes Emphysema in his father; Heart attack in his father; Heart disease in his father; Lung cancer in his mother. There is no history of Colon cancer, Esophageal cancer, Stomach cancer, or Rectal cancer. ROS:   Please see the history of present illness.    All other systems reviewed and are negative.  EKGs/Labs/Other Studies Reviewed:    The following studies were reviewed today:  EKG:  EKG ordered today and personally reviewed.  The ekg ordered today demonstrates ***  Recent Labs: 01/16/2021: ALT 3; BUN 23; Creatinine, Ser 1.23; Hemoglobin 16.7; Platelets 273.0; Potassium 4.6; Sodium 136  Recent Lipid Panel No results found for: CHOL, TRIG, HDL, CHOLHDL, VLDL, LDLCALC, LDLDIRECT  Physical Exam:    VS:  There were no vitals taken for this visit.    Wt Readings from Last 3 Encounters:  01/29/21 177 lb (80.3 kg)  01/16/21 177 lb 2 oz (80.3 kg)  10/26/20 174 lb 3.2 oz (79 kg)     GEN: *** Well nourished, well developed in no acute distress HEENT: Normal NECK: No JVD; No carotid bruits LYMPHATICS: No lymphadenopathy CARDIAC: ***RRR, no murmurs, rubs, gallops RESPIRATORY:  Clear to auscultation without rales, wheezing or rhonchi  ABDOMEN: Soft, non-tender, non-distended MUSCULOSKELETAL:  No edema; No deformity  SKIN: Warm and dry NEUROLOGIC:  Alert and oriented x 3 PSYCHIATRIC:  Normal affect    Signed, Norman Herrlich, MD  02/25/2021 12:55 PM    [facility]

## 2021-02-25 ENCOUNTER — Ambulatory Visit: Payer: Medicare Other | Admitting: Cardiology

## 2021-02-25 DIAGNOSIS — Z7901 Long term (current) use of anticoagulants: Secondary | ICD-10-CM

## 2021-02-25 DIAGNOSIS — I10 Essential (primary) hypertension: Secondary | ICD-10-CM

## 2021-02-25 DIAGNOSIS — Z87898 Personal history of other specified conditions: Secondary | ICD-10-CM | POA: Insufficient documentation

## 2021-02-25 DIAGNOSIS — I480 Paroxysmal atrial fibrillation: Secondary | ICD-10-CM

## 2021-02-25 DIAGNOSIS — D689 Coagulation defect, unspecified: Secondary | ICD-10-CM | POA: Insufficient documentation

## 2021-02-25 DIAGNOSIS — I25118 Atherosclerotic heart disease of native coronary artery with other forms of angina pectoris: Secondary | ICD-10-CM

## 2021-02-25 DIAGNOSIS — K2270 Barrett's esophagus without dysplasia: Secondary | ICD-10-CM | POA: Insufficient documentation

## 2021-02-25 DIAGNOSIS — E119 Type 2 diabetes mellitus without complications: Secondary | ICD-10-CM

## 2021-02-25 DIAGNOSIS — E782 Mixed hyperlipidemia: Secondary | ICD-10-CM

## 2021-02-25 DIAGNOSIS — I639 Cerebral infarction, unspecified: Secondary | ICD-10-CM | POA: Insufficient documentation

## 2021-02-25 DIAGNOSIS — Z8673 Personal history of transient ischemic attack (TIA), and cerebral infarction without residual deficits: Secondary | ICD-10-CM | POA: Insufficient documentation

## 2021-03-04 ENCOUNTER — Telehealth: Payer: Self-pay | Admitting: Gastroenterology

## 2021-03-04 NOTE — Telephone Encounter (Signed)
Pt states he has had several tests done and is calling for results/Recommendations. Explained that Dr. Chales Abrahams had been out of the office but returns this week, we will notify him once tests have been reviewed. Pt states he thinks he has gastroparesis. Please advise.

## 2021-03-04 NOTE — Telephone Encounter (Signed)
Pt states that he is still dealing with constipation despite taking miralax. Would like some advise.

## 2021-03-07 ENCOUNTER — Other Ambulatory Visit: Payer: Self-pay

## 2021-03-07 DIAGNOSIS — R109 Unspecified abdominal pain: Secondary | ICD-10-CM

## 2021-03-07 NOTE — Telephone Encounter (Signed)
Proceed with solid-phase gastric emptying scan (preferably off narcotics x 5 days-if unable to get off narcotics, then can do it while he is on) Then, needs follow-up.  RG

## 2021-03-08 NOTE — Telephone Encounter (Signed)
Spoke with pt and he is aware and knows he will be contacted in a few weeks to set up the GES. He knows to try to be off the narcotics for the test.

## 2021-03-10 NOTE — Progress Notes (Signed)
Cardiology Office Note:    Date:  03/11/2021   ID:  [facility], DOB 23-May-1961, MRN [id number]  PCP:  [facility]  Cardiologist:  Charles HerrlichBrian Munley, MD    Referring MD: No ref. provider found    ASSESSMENT:    1. Loss of consciousness (HCC)   2. PAF (paroxysmal atrial fibrillation) (HCC)   3. Chronic anticoagulation   4. Coronary artery disease of native artery of native heart with stable angina pectoris (HCC)   5. Primary hypertension   6. Mixed hyperlipidemia   7. Type 2 diabetes mellitus without complication, without long-term current use of insulin (HCC)   8. Syncope and collapse    PLAN:    In order of problems listed above:  1. The very atypical episode associated with tongue biting prolonged unresponsiveness post ictal symptoms sounds very much like a seizure.  He reminds me had seen him years ago when he had a myocardial perfusion study was given Lexiscan and tells me he had profound bradycardia and I told him never to have it done again it is conceivable bradycardia may have induced a seizure we will go ahead and apply a live ZIO monitor 2 weeks refer him for an EEG and if abnormal back to neurology 2. Stable maintaining sinus rhythm continue beta-blocker anticoagulant stop aspirin 3. Stable CAD having no anginal discomfort continue medical therapy including lipid-lowering oral nitrate beta-blocker 4. Stable continue current treatment 5. Continue with statin lipids at target 6. Continue diabetes his episode was not related to hypoglycemia   I [facility] and [facility] 02/25/2021 his diagnosis at that time was generalized weakness.  Blood pressure was 147/62 pulse 68 bpm oxygen saturation 95%.  Hemoglobin was 17.5 creatinine 0.90 potassium 4.3.  Had multiple tests done including COVID-19 negative portable chest x-ray that was clear and an EKG showed sinus bradycardia left bundle branch block CT of the head showed mild chronic ischemic [facility]  matter disease.  He had no specific diagnosis.  Next appointment: 4 weeks   Medication Adjustments/Labs and Tests Ordered: Current medicines are reviewed at length with the patient today.  Concerns regarding medicines are outlined above.  Orders Placed This Encounter  Procedures  . LONG TERM MONITOR-LIVE TELEMETRY (3-14 DAYS)  . EKG 12-Lead  . EEG adult   No orders of the defined types were placed in this encounter.   Chief Complaint  Patient presents with  . Follow-up  . Atrial Fibrillation  . Coronary Artery Disease    History of Present Illness:    Charles Huynh is a 60 y.o. male with a hx of paroxysmal atrial fibrillation and stroke with long-term anticoagulation CAD diabetes hyperlipidemia and hypertension last seen 10/26/2020.  Cardiac CTA showed a calcium score of 50 ,57th percentile for age and sex CAD was present involving the left anterior descending coronary artery 1 to 24% left circumflex 50 to 69% and right coronary artery free of disease.  Compliance with diet, lifestyle and medications: Yes  Is worked in my office hours because he had an episode 2 weeks ago. He woke up 6 AM went to the bathroom and noticed that he had bitten his tongue.  He did not feel well but very weak fatigued went downstairs got on the couch and lost consciousness and was not wearing for about 2 hours during that time his blood pressure was 160/80 pulse 62 Charles was 243.  The family called EMS who came to the home so  they are concerned he had a narcotic overdose but did not give him Narcan.  He is brought [facility] with a concern he is having a stroke he had a CT scan multiple lab test in the end I told the family he may have had a narcotic overdose but again was never given Narcan.  When he came to he sounds like he was postictal his thought was not right and it took him a day to recover.  He had no witnessed syncope.  He thinks he may have had a seizure in the past.  Most recent  labs 01/23/2019 to [facility] [place] [facility]CP: [facility] creatinine 1.13 sodium 140 potassium 4.3 GFR 74 cc TSH 2.61 Cholesterol 147 LDL 77 triglycerides 223 HDL 39 Hemoglobin 16.6  His echocardiogram 10/31/2019 showed EF of 60 to 65% mild concentric LVH elevated left atrial left ventricular filling pressure normal right ventricular function normal left and right atrial enlargement and no significant valvular abnormality. Past Medical History:  Diagnosis Date  . Anxiety   . Atrial fibrillation (HCC)   . Barrett esophagus   . Cerebrovascular accident (HCC) 07/09/2017  . Chronic pain syndrome 07/09/2017  . Clotting disorder (HCC)   . Coronary artery disease of native artery of native heart with stable angina pectoris (HCC) 01/03/2020  . 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 Depression   . Diabetes (HCC)   . Essential hypertension 08/30/2019  . Gastroesophageal reflux disease without esophagitis 09/02/2017  . Heart disease   . History of ulcer disease   . Hx pulmonary embolism   . Hyperlipidemia   . Hypertension   . Hypothyroidism 07/09/2017  . Mixed hyperlipidemia 01/03/2020  . PAF (paroxysmal atrial fibrillation) (HCC) 01/03/2020  . Parkinson's disease (HCC)   . Stroke (HCC)   . Thyroid disease   . Type 2 diabetes mellitus without complication (HCC) 07/09/2017    Past Surgical History:  Procedure Laterality Date  . BACK SURGERY    . CARDIAC CATHETERIZATION    . COLONOSCOPY  2019   have a small polyp [place] Dr Philis Kendall  . ESOPHAGOGASTRODUODENOSCOPY  2019   Dr Philis Kendall. [place]  . KNEE ARTHROSCOPY Left   . NECK SURGERY    . SPINAL CORD STIMULATOR INSERTION    . WRIST SURGERY Bilateral     Current Medications: Current Meds  Medication Sig  . albuterol (VENTOLIN HFA) 108 (90 Base) MCG/ACT inhaler Inhale 2 puffs into the lungs every 4 (four) hours as needed.  Marland Kitchen aspirin EC 81 MG tablet Take 1 tablet (81 mg total) by mouth daily.  . baclofen (LIORESAL) 10  MG tablet Take 10 mg by mouth 2 (two) times daily.  . carbidopa-levodopa (SINEMET CR) 50-200 MG tablet Take 2 tablets by mouth 3 (three) times daily.  . citalopram (CELEXA) 40 MG tablet Take 40 mg by mouth daily.  Marland Kitchen DILT-XR 120 MG 24 hr capsule Take 120 mg by mouth daily.  Marland Kitchen ELIQUIS 5 MG TABS tablet Take 1 tablet (5 mg total) by mouth 2 (two) times daily.  Marland Kitchen esomeprazole (NEXIUM) 40 MG capsule Take 40 mg by mouth daily at 12 noon.  . fentaNYL (DURAGESIC) 25 MCG/HR as directed. Apply every 2.5 days  . hydrochlorothiazide (MICROZIDE) 12.5 MG capsule Take 1 capsule (12.5 mg total) by mouth daily.  . isosorbide mononitrate (IMDUR) 30 MG 24 hr tablet Take 30 mg by mouth daily.  Marland Kitchen JANUVIA 100 MG tablet Take 100 mg by mouth daily.  Marland Kitchen JARDIANCE  25 MG TABS tablet Take 25 mg by mouth daily.  Marland Kitchen levothyroxine (SYNTHROID) 50 MCG tablet TK 1 T PO QD  . lovastatin (MEVACOR) 20 MG tablet Take 1 tablet by mouth daily.  . metFORMIN (GLUCOPHAGE-XR) 500 MG 24 hr tablet Take 1,000 mg by mouth 2 (two) times daily.  . metoCLOPramide (REGLAN) 10 MG tablet TK 1 T PO BID  . metoprolol tartrate (LOPRESSOR) 100 MG tablet Take 1 tablet (100 mg total) by mouth 2 (two) times daily.  . nitroGLYCERIN (NITROSTAT) 0.4 MG SL tablet PLACE 1 TABLET UNDER THE TONGUE EVERY 5 MINUTES AS NEEDED FOR CHEST PAIN  . NUPLAZID 34 MG CAPS Take 1 capsule by mouth daily.  Marland Kitchen oxyCODONE-acetaminophen (PERCOCET) 10-325 MG tablet Take 1 tablet by mouth 4 (four) times daily.   . potassium chloride (KLOR-CON) 10 MEQ tablet Take 1 tablet by mouth daily  . pregabalin (LYRICA) 300 MG capsule Take 300 mg by mouth 2 (two) times daily.  . promethazine (PHENERGAN) 25 MG tablet Take 25 mg by mouth every 6 (six) hours as needed for nausea or vomiting.  Marland Kitchen testosterone cypionate (DEPOTESTOSTERONE CYPIONATE) 200 MG/ML injection Inject 200 mg into the muscle every 14 (fourteen) days.     Allergies:   Naproxen sodium   Social History   Socioeconomic History   . Marital status: Married    Spouse name: Not on file  . Number of children: Not on file  . Years of education: Not on file  . Highest education level: Not on file  Occupational History  . Not on file  Tobacco Use  . Smoking status: Current Every Day Smoker    Packs/day: 1.00    Years: 47.00    Pack years: 47.00    Types: Cigarettes  . Smokeless tobacco: Never Used  Vaping Use  . Vaping Use: Never used  Substance and Sexual Activity  . Alcohol use: Not Currently    Comment: very seldom; 1 -2 times per year  . Drug use: Never  . Sexual activity: Not on file  Other Topics Concern  . Not on file  Social History Narrative  . Not on file   Social Determinants of Health   Financial Resource Strain: Not on file  Food Insecurity: Not on file  Transportation Needs: Not on file  Physical Activity: Not on file  Stress: Not on file  Social Connections: Not on file     Family History: The patient's family history includes Emphysema in his father; Heart attack in his father; Heart disease in his father; Lung cancer in his mother. There is no history of Colon cancer, Esophageal cancer, Stomach cancer, or Rectal cancer. ROS:   Please see the history of present illness.    All other systems reviewed and are negative.  EKGs/Labs/Other Studies Reviewed:    The following studies were reviewed today:  EKG:  EKG ordered today and personally reviewed.  The ekg ordered today demonstrates sinus rhythm left bundle branch block  Recent Labs: 01/16/2021: ALT 3; BUN 23; Creatinine, Ser 1.23; Hemoglobin 16.7; Platelets 273.0; Potassium 4.6; Sodium 136  Recent Lipid Panel No results found for: CHOL, TRIG, HDL, CHOLHDL, VLDL, LDLCALC, LDLDIRECT  Physical Exam:    VS:  BP 104/60   Pulse 72   Ht 5\' 10"  (1.778 m)   Wt 171 lb 12.8 oz (77.9 kg)   SpO2 94%   BMI 24.65 kg/m     Wt Readings from Last 3 Encounters:  03/11/21 171 lb 12.8 oz (77.9  kg)  01/29/21 177 lb (80.3 kg)  01/16/21 177  lb 2 oz (80.3 kg)     GEN:  Well nourished, well developed in no acute distress HEENT: Normal NECK: No JVD; No carotid bruits LYMPHATICS: No lymphadenopathy CARDIAC: RRR, no murmurs, rubs, gallops RESPIRATORY:  Clear to auscultation without rales, wheezing or rhonchi  ABDOMEN: Soft, non-tender, non-distended MUSCULOSKELETAL:  No edema; No deformity  SKIN: Warm and dry NEUROLOGIC:  Alert and oriented x 3 PSYCHIATRIC:  Normal affect    Signed, Charles Herrlich, MD  03/11/2021 3:35 PM    [facility]

## 2021-03-11 ENCOUNTER — Ambulatory Visit (INDEPENDENT_AMBULATORY_CARE_PROVIDER_SITE_OTHER): Payer: Medicare Other

## 2021-03-11 ENCOUNTER — Encounter: Payer: Self-pay | Admitting: Cardiology

## 2021-03-11 ENCOUNTER — Ambulatory Visit: Payer: Medicare Other | Admitting: Cardiology

## 2021-03-11 ENCOUNTER — Other Ambulatory Visit: Payer: Self-pay

## 2021-03-11 VITALS — BP 104/60 | HR 72 | Ht 70.0 in | Wt 171.8 lb

## 2021-03-11 DIAGNOSIS — I480 Paroxysmal atrial fibrillation: Secondary | ICD-10-CM

## 2021-03-11 DIAGNOSIS — E119 Type 2 diabetes mellitus without complications: Secondary | ICD-10-CM

## 2021-03-11 DIAGNOSIS — R002 Palpitations: Secondary | ICD-10-CM

## 2021-03-11 DIAGNOSIS — I10 Essential (primary) hypertension: Secondary | ICD-10-CM | POA: Diagnosis not present

## 2021-03-11 DIAGNOSIS — R110 Nausea: Secondary | ICD-10-CM | POA: Insufficient documentation

## 2021-03-11 DIAGNOSIS — M62830 Muscle spasm of back: Secondary | ICD-10-CM | POA: Insufficient documentation

## 2021-03-11 DIAGNOSIS — R4020 Unspecified coma: Secondary | ICD-10-CM | POA: Diagnosis not present

## 2021-03-11 DIAGNOSIS — R1310 Dysphagia, unspecified: Secondary | ICD-10-CM | POA: Insufficient documentation

## 2021-03-11 DIAGNOSIS — R5383 Other fatigue: Secondary | ICD-10-CM

## 2021-03-11 DIAGNOSIS — I25118 Atherosclerotic heart disease of native coronary artery with other forms of angina pectoris: Secondary | ICD-10-CM

## 2021-03-11 DIAGNOSIS — F17200 Nicotine dependence, unspecified, uncomplicated: Secondary | ICD-10-CM | POA: Insufficient documentation

## 2021-03-11 DIAGNOSIS — R0600 Dyspnea, unspecified: Secondary | ICD-10-CM | POA: Insufficient documentation

## 2021-03-11 DIAGNOSIS — E782 Mixed hyperlipidemia: Secondary | ICD-10-CM

## 2021-03-11 DIAGNOSIS — M25519 Pain in unspecified shoulder: Secondary | ICD-10-CM

## 2021-03-11 DIAGNOSIS — Z7901 Long term (current) use of anticoagulants: Secondary | ICD-10-CM | POA: Diagnosis not present

## 2021-03-11 DIAGNOSIS — E291 Testicular hypofunction: Secondary | ICD-10-CM

## 2021-03-11 DIAGNOSIS — E785 Hyperlipidemia, unspecified: Secondary | ICD-10-CM

## 2021-03-11 DIAGNOSIS — T148XXA Other injury of unspecified body region, initial encounter: Secondary | ICD-10-CM | POA: Insufficient documentation

## 2021-03-11 DIAGNOSIS — R55 Syncope and collapse: Secondary | ICD-10-CM

## 2021-03-11 HISTORY — DX: Nausea: R11.0

## 2021-03-11 HISTORY — DX: Other injury of unspecified body region, initial encounter: T14.8XXA

## 2021-03-11 HISTORY — DX: Muscle spasm of back: M62.830

## 2021-03-11 HISTORY — DX: Testicular hypofunction: E29.1

## 2021-03-11 HISTORY — DX: Dyspnea, unspecified: R06.00

## 2021-03-11 HISTORY — DX: Hyperlipidemia, unspecified: E78.5

## 2021-03-11 HISTORY — DX: Pain in unspecified shoulder: M25.519

## 2021-03-11 HISTORY — DX: Nicotine dependence, unspecified, uncomplicated: F17.200

## 2021-03-11 HISTORY — DX: Dysphagia, unspecified: R13.10

## 2021-03-11 HISTORY — DX: Other fatigue: R53.83

## 2021-03-11 HISTORY — DX: Palpitations: R00.2

## 2021-03-11 NOTE — Patient Instructions (Addendum)
Medication Instructions:  Your physician has recommended you make the following change in your medication:  STOP: Aspirin   *If you need a refill on your cardiac medications before your next appointment, please call your pharmacy*   Lab Work: None If you have labs (blood work) drawn today and your tests are completely normal, you will receive your results only by: Marland Kitchen MyChart Message (if you have MyChart) OR . A paper copy in the mail If you have any lab test that is abnormal or we need to change your treatment, we will call you to review the results.   Testing/Procedures: A zio monitor was ordered today. It will remain on for 14 days. You will then return monitor and event diary in provided box. It takes 1-2 weeks for report to be downloaded and returned to [place]. We will call you with the results. If monitor falls off or has orange flashing light, please call Zio for further instructions.   We have placed the order for you to have an EEG completed at [facility]. Once this is authorized with your insurance we will call you with the test scheduled.    Follow-Up: At [facility], you and your health needs are our priority.  As part of our continuing mission to provide you with exceptional heart care, we have created designated Provider Care Teams.  These Care Teams include your primary Cardiologist (physician) and Advanced Practice Providers (APPs -  Physician Assistants and Nurse Practitioners) who all work together to provide you with the care you need, when you need it.  We recommend signing up for the patient portal called "MyChart".  Sign up information is provided on this After Visit Summary.  MyChart is used to connect with patients for Virtual Visits (Telemedicine).  Patients are able to view lab/test results, encounter notes, upcoming appointments, etc.  Non-urgent messages can be sent to your provider as well.   To learn more about what you can do with MyChart, go to  [email or web address].    Your next appointment:   4 week(s)  The format for your next appointment:   In Person  Provider:   Norman Herrlich, MD   Other Instructions

## 2021-03-15 ENCOUNTER — Telehealth: Payer: Self-pay | Admitting: Cardiology

## 2021-03-15 NOTE — Telephone Encounter (Signed)
    Pt is returning call, he said it's about his EEG scheduling at [facility]

## 2021-03-15 NOTE — Telephone Encounter (Signed)
Message sent to [facility] as she scheduled this test for the patient

## 2021-03-19 DIAGNOSIS — I480 Paroxysmal atrial fibrillation: Secondary | ICD-10-CM | POA: Diagnosis not present

## 2021-03-19 DIAGNOSIS — R55 Syncope and collapse: Secondary | ICD-10-CM

## 2021-04-01 ENCOUNTER — Telehealth: Payer: Self-pay

## 2021-04-01 NOTE — Telephone Encounter (Signed)
-----   Message from Baldo Daub, MD sent at 03/30/2021 10:46 AM EDT ----- His EEG [facility] 03/20/2021 is normal

## 2021-04-01 NOTE — Telephone Encounter (Signed)
Left a detailed message on patients voicemail letting him know these results and gave him my call back number to call back with any questions or concerns.

## 2021-04-04 ENCOUNTER — Encounter (HOSPITAL_COMMUNITY)
Admission: RE | Admit: 2021-04-04 | Discharge: 2021-04-04 | Disposition: A | Discharge status: Home / Self Care | Payer: Medicare Other | Source: Ambulatory Visit | Attending: Gastroenterology | Admitting: Gastroenterology

## 2021-04-04 ENCOUNTER — Other Ambulatory Visit: Payer: Self-pay

## 2021-04-04 DIAGNOSIS — R109 Unspecified abdominal pain: Secondary | ICD-10-CM | POA: Insufficient documentation

## 2021-04-04 MED ORDER — TECHNETIUM TC 99M SULFUR COLLOID
2.2000 | Freq: Once | INTRAVENOUS | Status: AC | PRN
  Administered 2021-04-04: 2.2 via INTRAVENOUS

## 2021-04-08 ENCOUNTER — Other Ambulatory Visit: Payer: Self-pay

## 2021-04-08 DIAGNOSIS — G8929 Other chronic pain: Secondary | ICD-10-CM | POA: Insufficient documentation

## 2021-04-08 DIAGNOSIS — M25512 Pain in left shoulder: Secondary | ICD-10-CM

## 2021-04-08 HISTORY — DX: Pain in left shoulder: M25.512

## 2021-04-08 HISTORY — DX: Other chronic pain: G89.29

## 2021-04-11 ENCOUNTER — Ambulatory Visit: Payer: Medicare Other | Admitting: Cardiology

## 2021-12-13 ENCOUNTER — Other Ambulatory Visit: Payer: Self-pay | Admitting: Cardiology

## 2021-12-13 NOTE — Telephone Encounter (Signed)
Isosorbide mononitrate 60 mg # 30 only with message for patient needs appointment for future refills / 1st attempt

## 2021-12-20 ENCOUNTER — Other Ambulatory Visit: Payer: Self-pay | Admitting: Cardiology

## 2022-01-14 ENCOUNTER — Other Ambulatory Visit: Payer: Self-pay

## 2022-01-14 ENCOUNTER — Ambulatory Visit: Payer: Medicare Other | Admitting: Cardiology

## 2022-01-14 ENCOUNTER — Encounter: Payer: Self-pay | Admitting: Cardiology

## 2022-01-14 VITALS — BP 120/70 | HR 79 | Ht 70.0 in | Wt 171.2 lb

## 2022-01-14 DIAGNOSIS — R079 Chest pain, unspecified: Secondary | ICD-10-CM

## 2022-01-14 DIAGNOSIS — F17200 Nicotine dependence, unspecified, uncomplicated: Secondary | ICD-10-CM

## 2022-01-14 DIAGNOSIS — I480 Paroxysmal atrial fibrillation: Secondary | ICD-10-CM

## 2022-01-14 DIAGNOSIS — I25118 Atherosclerotic heart disease of native coronary artery with other forms of angina pectoris: Secondary | ICD-10-CM

## 2022-01-14 DIAGNOSIS — E782 Mixed hyperlipidemia: Secondary | ICD-10-CM

## 2022-01-14 DIAGNOSIS — E119 Type 2 diabetes mellitus without complications: Secondary | ICD-10-CM

## 2022-01-14 DIAGNOSIS — Z79899 Other long term (current) drug therapy: Secondary | ICD-10-CM | POA: Diagnosis not present

## 2022-01-14 LAB — CBC WITH DIFFERENTIAL/PLATELET
Basophils Absolute: 0.1 10*3/uL (ref 0.0–0.2)
Basos: 1 %
EOS (ABSOLUTE): 0.2 10*3/uL (ref 0.0–0.4)
Eos: 4 %
Hematocrit: 49.7 % (ref 37.5–51.0)
Hemoglobin: 16.7 g/dL (ref 13.0–17.7)
Immature Grans (Abs): 0 10*3/uL (ref 0.0–0.1)
Immature Granulocytes: 0 %
Lymphocytes Absolute: 2.6 10*3/uL (ref 0.7–3.1)
Lymphs: 41 %
MCH: 29.7 pg (ref 26.6–33.0)
MCHC: 33.6 g/dL (ref 31.5–35.7)
MCV: 88 fL (ref 79–97)
Monocytes Absolute: 0.6 10*3/uL (ref 0.1–0.9)
Monocytes: 9 %
Neutrophils Absolute: 2.9 10*3/uL (ref 1.4–7.0)
Neutrophils: 45 %
Platelets: 219 10*3/uL (ref 150–450)
RBC: 5.62 x10E6/uL (ref 4.14–5.80)
RDW: 11.8 % (ref 11.6–15.4)
WBC: 6.4 10*3/uL (ref 3.4–10.8)

## 2022-01-14 LAB — BASIC METABOLIC PANEL
BUN/Creatinine Ratio: 23 (ref 10–24)
BUN: 22 mg/dL (ref 8–27)
CO2: 25 mmol/L (ref 20–29)
Calcium: 9.7 mg/dL (ref 8.6–10.2)
Chloride: 97 mmol/L (ref 96–106)
Creatinine, Ser: 0.97 mg/dL (ref 0.76–1.27)
Glucose: 420 mg/dL — ABNORMAL HIGH (ref 70–99)
Potassium: 4.4 mmol/L (ref 3.5–5.2)
Sodium: 133 mmol/L — ABNORMAL LOW (ref 134–144)
eGFR: 88 mL/min/{1.73_m2} (ref >59)

## 2022-01-14 LAB — MAGNESIUM: Magnesium: 2.2 mg/dL (ref 1.6–2.3)

## 2022-01-14 MED ORDER — RANOLAZINE ER 500 MG PO TB12: 500.0000 mg | ORAL_TABLET | Freq: Two times a day (BID) | ORAL | 3 refills | Status: DC

## 2022-01-14 MED ORDER — NITROGLYCERIN 0.4 MG SL SUBL: SUBLINGUAL_TABLET | SUBLINGUAL | 6 refills | Status: DC

## 2022-01-14 MED ORDER — ROSUVASTATIN CALCIUM 20 MG PO TABS: 20.0000 mg | ORAL_TABLET | Freq: Every day | ORAL | 3 refills | Status: AC

## 2022-01-14 NOTE — H&P (View-Only) (Signed)
Cardiology Office Note:    Date:  01/14/2022   ID:  Charles Huynh, DOB 12/16/1960, MRN [id number]  PCP:  Georganna Skeans, PA-C  Cardiologist:  Berniece Salines, DO  Electrophysiologist:  None   Referring MD: Georganna Skeans, PA-C   " I am having chest pain"  History of Present Illness:    Charles Huynh is a 61 y.o. male with a hx of moderate coronary artery disease on coronary CT scan, paroxysmal atrial fibrillation on Eliquis 5 mg twice daily, TIA, CVA is here today for follow-up visit.  I saw the patient November 2021 at that time he appeared to be doing well from a cardiovascular standpoint.  He was going through a lot with his family life.  There were no changes to his medication.  Continue all of his medicines.  In the interim the patient did have a hospitalization which was suspected that he may have had a seizure.  He did see Dr. Bettina Gavia back in March 2022 at that time of the a monitor was placed on the patient.,  He did wear the monitor which showed some runs of supraventricular tachycardia.  The patient tells me over the last several months he has had a very hard time.  He has had increasing episodes of chest discomfort.  He describes it as a midsternal pain and feels like elephant sitting on his chest.  He has been taking nitroglycerin every day.  He also has been taking all of his medications which includes Imdur for his antianginal.  He is concerned because now he feels as if this is getting worse.  His wife who is with him in the room tells me that he is waking up from sleep with the symptoms.  Most times it is a pressure-like sensation radiates over to his shoulder.  He has not had to go up to his jaw.  There are times some associated shortness of breath.   Past Medical History:  Diagnosis Date   Anxiety    Atrial fibrillation (HCC)    Barrett esophagus    Benign prostatic hyperplasia with urinary obstruction 12/28/2015   Cerebrovascular accident ([place]) 07/09/2017   Chronic pain of  both shoulders 04/08/2021   Chronic pain syndrome 07/09/2017   Clotting disorder ([place])    Coronary artery disease of native artery of native heart with stable angina pectoris ([place]) 01/03/2020   Decreased testosterone level 07/09/2017   Depression    Diabetes ([place])    Dyslipidemia 03/11/2021   Dysphagia 03/11/2021   Dyspnea 03/11/2021   Essential hypertension 08/30/2019   Fatigue 03/11/2021   Gastroesophageal reflux disease without esophagitis 09/02/2017   Gastroparesis 01/24/2021   Heart disease    History of ulcer disease    Hx pulmonary embolism    Hyperlipidemia    Hypertension    Hypogonadism in male 03/11/2021   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 02/06/2021   Hypothyroidism 07/09/2017   Intermittent palpitations 03/11/2021   Mild episode of recurrent major depressive disorder ([place]) 01/24/2021   Mixed hyperlipidemia 01/03/2020   Nausea 03/11/2021   Nicotine dependence 03/11/2021   PAF (paroxysmal atrial fibrillation) ([place]) 01/03/2020   Parkinson's disease ([place])    Shoulder pain 03/11/2021   Smoker 03/11/2021   Spasm of back muscles 03/11/2021   Stroke [facility])    Thyroid disease    Type 2 diabetes mellitus without complication ([place]) 99991111   Wound of skin 03/11/2021    Past Surgical History:  Procedure Laterality Date   BACK SURGERY  CARDIAC CATHETERIZATION     COLONOSCOPY  2019   have a small polyp [place] Dr Derry Lory   ESOPHAGOGASTRODUODENOSCOPY  2019   Dr Derry Lory. [place]   KNEE ARTHROSCOPY Left    NECK SURGERY     SPINAL CORD STIMULATOR INSERTION     WRIST SURGERY Bilateral     Current Medications: Current Meds  Medication Sig   albuterol (VENTOLIN HFA) 108 (90 Base) MCG/ACT inhaler Inhale 2 puffs into the lungs every 4 (four) hours as needed.   baclofen (LIORESAL) 10 MG tablet Take 10 mg by mouth 2 (two) times daily.   carbidopa-levodopa (SINEMET CR) 50-200 MG tablet Take 2 tablets by mouth 3 (three) times daily.   citalopram (CELEXA) 40 MG tablet Take 40 mg by  mouth daily.   D3-50 1.25 MG (50000 UT) capsule Take 50,000 Units by mouth once a week.   DILT-XR 120 MG 24 hr capsule Take 120 mg by mouth daily.   ELIQUIS 5 MG TABS tablet Take 1 tablet (5 mg total) by mouth 2 (two) times daily.   esomeprazole (NEXIUM) 40 MG capsule Take 40 mg by mouth daily at 12 noon.   fentaNYL (DURAGESIC) 25 MCG/HR as directed. Apply every 2.5 days   hydrochlorothiazide (MICROZIDE) 12.5 MG capsule Take 1 capsule (12.5 mg total) by mouth daily.   isosorbide mononitrate (IMDUR) 60 MG 24 hr tablet Take 1 tablet (60 mg total) by mouth daily. Needs appointment for future refills / 1st attempt   JANUVIA 100 MG tablet Take 100 mg by mouth daily.   JARDIANCE 25 MG TABS tablet Take 25 mg by mouth daily.   levothyroxine (SYNTHROID) 75 MCG tablet Take 75 mcg by mouth daily.   metFORMIN (GLUCOPHAGE-XR) 500 MG 24 hr tablet Take 1,000 mg by mouth 2 (two) times daily.   metoCLOPramide (REGLAN) 10 MG tablet TK 1 T PO BID   metoprolol tartrate (LOPRESSOR) 100 MG tablet Take 1 tablet (100 mg total) by mouth 2 (two) times daily.   NUPLAZID 34 MG CAPS Take 1 capsule by mouth daily.   oxyCODONE (ROXICODONE) 15 MG immediate release tablet Take 15 mg by mouth 5 (five) times daily as needed for pain.   potassium chloride (KLOR-CON) 10 MEQ tablet Take 1 tablet by mouth daily   pregabalin (LYRICA) 300 MG capsule Take 300 mg by mouth 2 (two) times daily.   promethazine (PHENERGAN) 25 MG tablet Take 25 mg by mouth every 6 (six) hours as needed for nausea or vomiting.   ranolazine (RANEXA) 500 MG 12 hr tablet Take 1 tablet (500 mg total) by mouth 2 (two) times daily.   rosuvastatin (CRESTOR) 20 MG tablet Take 1 tablet (20 mg total) by mouth daily.   testosterone cypionate (DEPOTESTOSTERONE CYPIONATE) 200 MG/ML injection Inject 200 mg into the muscle every 14 (fourteen) days.   TRESIBA FLEXTOUCH 100 UNIT/ML FlexTouch Pen Inject 7 Units into the skin at bedtime.   [DISCONTINUED] lovastatin (MEVACOR)  20 MG tablet Take 1 tablet by mouth daily.   [DISCONTINUED] nitroGLYCERIN (NITROSTAT) 0.4 MG SL tablet PLACE 1 TABLET UNDER THE TONGUE EVERY 5 MINUTES AS NEEDED FOR CHEST PAIN     Allergies:   Naproxen sodium   Social History   Socioeconomic History   Marital status: Married    Spouse name: Not on file   Number of children: Not on file   Years of education: Not on file   Highest education level: Not on file  Occupational History   Not on  file  Tobacco Use   Smoking status: Every Day    Packs/day: 1.00    Years: 47.00    Pack years: 47.00    Types: Cigarettes   Smokeless tobacco: Never  Vaping Use   Vaping Use: Never used  Substance and Sexual Activity   Alcohol use: Not Currently    Comment: very seldom; 1 -2 times per year   Drug use: Never   Sexual activity: Not on file  Other Topics Concern   Not on file  Social History Narrative   Not on file   Social Determinants of Health   Financial Resource Strain: Not on file  Food Insecurity: Not on file  Transportation Needs: Not on file  Physical Activity: Not on file  Stress: Not on file  Social Connections: Not on file     Family History: The patient's family history includes Emphysema in his father; Heart attack in his father; Heart disease in his father; Lung cancer in his mother. There is no history of Colon cancer, Esophageal cancer, Stomach cancer, or Rectal cancer.  ROS:   Review of Systems  Constitution: Negative for decreased appetite, fever and weight gain.  HENT: Negative for congestion, ear discharge, hoarse voice and sore throat.   Eyes: Negative for discharge, redness, vision loss in right eye and visual halos.  Cardiovascular: Negative for chest pain, dyspnea on exertion, leg swelling, orthopnea and palpitations.  Respiratory: Negative for cough, hemoptysis, shortness of breath and snoring.   Endocrine: Negative for heat intolerance and polyphagia.  Hematologic/Lymphatic: Negative for bleeding  problem. Does not bruise/bleed easily.  Skin: Negative for flushing, nail changes, rash and suspicious lesions.  Musculoskeletal: Negative for arthritis, joint pain, muscle cramps, myalgias, neck pain and stiffness.  Gastrointestinal: Negative for abdominal pain, bowel incontinence, diarrhea and excessive appetite.  Genitourinary: Negative for decreased libido, genital sores and incomplete emptying.  Neurological: Negative for brief paralysis, focal weakness, headaches and loss of balance.  Psychiatric/Behavioral: Negative for altered mental status, depression and suicidal ideas.  Allergic/Immunologic: Negative for HIV exposure and persistent infections.    EKGs/Labs/Other Studies Reviewed:    The following studies were reviewed today:   EKG:  The ekg ordered today demonstrates   [facility] 10/26/2020 Zio Monitor The patient wore the monitor for 7 days. Indication: Paroxysmal atrial fibrillation. The minimal HR was 59 bpm, with maximal HR of 164 bpm, and average HR of 76 bpm. Predominant underlying rhythm was Sinus Rhythm. Bundle Branch Block/IVCD was present.  QRS morphology changes were present throughout recording.  4 Supraventricular Tachycardia runs occurred, the run with the fastest interval lasting 10 beats with a max rate of 164 bpm, the longest lasting 12 beats with an average rate of 139bpm.  The supraventricular tachycardia is likely atrial tachycardia. Supraventricular Tachycardia was associated with of symptomatic patient triggered event. Premature atrial complexes were rare (<1.0%). Premature ventricular complexes were rare (<1.0%), No AV block, no pauses, no atrial fibrillation.   Transthoracic echocardiogram 10/31/2019 IMPRESSIONS   1. Left ventricular ejection fraction, by visual estimation, is 60 to 65%. The left ventricle has normal function. Left ventricular septal wall thickness was severely increased. Mildly increased left ventricular posterior wall thickness. There is   asymetrical left ventricular hypertrophy with sigmoidal basal septum thickness.  2. Left ventricular diastolic parameters are consistent with Grade II diastolic dysfunction (pseudonormalization).  3. Global right ventricle has normal systolic function.The right ventricular size is normal. No increase in right ventricular wall thickness.  4. Left atrial size was normal.  5. Right atrial size was normal.  6. Mild mitral annular calcification.  7. The mitral valve is normal in structure. Trace mitral valve regurgitation. No evidence of mitral stenosis.  8. The tricuspid valve is normal in structure. Tricuspid valve regurgitation is not demonstrated.  9. The pulmonic valve was normal in structure. Pulmonic valve regurgitation is not visualized. 10. The aortic valve is normal in structure. Aortic valve regurgitation is not visualized. No evidence of aortic valve sclerosis or stenosis. 11. Normal pulmonary artery systolic pressure.   Coronary CTA 10/12/2019 Coronary calcium score: The patient's coronary artery calcium score is 50.35, which places the patient in the 57th percentile. Calcium noted in the LAD and LCx.   Coronary arteries: Normal coronary origins.  Left dominance.   Right Coronary Artery: Dominant vessel. No significant stenosis - mis-registration artifact in the mid-vessel.   Left Main Coronary Artery: Appears normal. Bifurcates into the LAD and LCx vessels.   Left Anterior Descending Coronary Artery: Coarses anteriorly and gives off a large high diagonal branch, neither branch reaches the apex. There is minimal 1-24% proximal mixed stenosis (CADRADS1).   Left Circumflex Artery: The AV groove LCX demonstrates no significant disease. There is a large OM1 branch with multiple sub-branches. A moderate 50-69% mixed stenosis (CADRADS3) of the proximal OM is noted.   Aorta: Normal size, 26 mm at the mid ascending aorta (level of the PA bifurcation) measured double oblique. No  calcifications. No Dissection. IMPRESSION: 1. Moderate stenosis of a large OM branch and minimal LAD disease, CADRADS = 3. CT FFR will be performed and reported separately.   2. Coronary calcium score of 50.35. This was 57th percentile for age and sex matched control.   3. Normal coronary origin with left dominance.   4. Mildly dilated main PA at 30 mm.  Recent Labs: 01/16/2021: ALT 3 01/14/2022: BUN 22; Creatinine, Ser 0.97; Hemoglobin 16.7; Magnesium 2.2; Platelets 219; Potassium 4.4; Sodium 133  Recent Lipid Panel No results found for: CHOL, TRIG, HDL, CHOLHDL, VLDL, LDLCALC, LDLDIRECT  Physical Exam:    VS:  BP 120/70 (BP Location: Left Arm, Patient Position: Sitting)    Pulse 79    Ht 5\' 10"  (1.778 m)    Wt 171 lb 3.2 oz (77.7 kg)    SpO2 95%    BMI 24.56 kg/m     Wt Readings from Last 3 Encounters:  01/14/22 171 lb 3.2 oz (77.7 kg)  03/11/21 171 lb 12.8 oz (77.9 kg)  01/29/21 177 lb (80.3 kg)     GEN: Well nourished, well developed in no acute distress HEENT: Normal NECK: No JVD; No carotid bruits LYMPHATICS: No lymphadenopathy CARDIAC: S1S2 noted,RRR, no murmurs, rubs, gallops RESPIRATORY:  Clear to auscultation without rales, wheezing or rhonchi  ABDOMEN: Soft, non-tender, non-distended, +bowel sounds, no guarding. EXTREMITIES: No edema, No cyanosis, no clubbing MUSCULOSKELETAL:  No deformity  SKIN: Warm and dry NEUROLOGIC:  Alert and oriented x 3, non-focal PSYCHIATRIC:  Normal affect, good insight  ASSESSMENT:    1. Coronary artery disease of native artery of native heart with stable angina pectoris ([place])   2. Medication management   3. Chest pain of uncertain etiology   4. Paroxysmal atrial fibrillation (HCC)   5. Type 2 diabetes mellitus without complication, without long-term current use of insulin ([place])   6. Mixed hyperlipidemia   7. Smoker    PLAN:    His symptoms are concerning and appears to be worsening.  I will add Ranexa 500 mg to his  medication  regimen and plan to refer the patient for heart catheterization as he is high risk for progression of his coronary artery disease.  The patient understands that risks include but are not limited to stroke (1 in 1000), death (1 in 63), kidney failure [usually temporary] (1 in 500), bleeding (1 in 200), allergic reaction [possibly serious] (1 in 200), and agrees to proceed.  Blood pressure is acceptable, continue with current antihypertensive regimen.  Hyperlipidemia - continue with current statin medication.  His diabetes mellitus is being managed by his primary doctor.  Smoking cessation advised  The patient is in agreement with the above plan. The patient left the office in stable condition.  The patient will follow up in 4 weeks or sooner if needed.   Medication Adjustments/Labs and Tests Ordered: Current medicines are reviewed at length with the patient today.  Concerns regarding medicines are outlined above.  Orders Placed This Encounter  Procedures   Basic Metabolic Panel (BMET)   CBC with Differential/Platelet   Magnesium   EKG 12-Lead   Meds ordered this encounter  Medications   nitroGLYCERIN (NITROSTAT) 0.4 MG SL tablet    Sig: PLACE 1 TABLET UNDER THE TONGUE EVERY 5 MINUTES AS NEEDED FOR CHEST PAIN    Dispense:  25 tablet    Refill:  6   ranolazine (RANEXA) 500 MG 12 hr tablet    Sig: Take 1 tablet (500 mg total) by mouth 2 (two) times daily.    Dispense:  180 tablet    Refill:  3   rosuvastatin (CRESTOR) 20 MG tablet    Sig: Take 1 tablet (20 mg total) by mouth daily.    Dispense:  90 tablet    Refill:  3    Patient Instructions  Medication Instructions:  Your physician has recommended you make the following change in your medication:  STOP: Lovastatin (Mevacor) START: Crestor 20 mg once daily START: Ranexa 500 mg twice daily  *If you need a refill on your cardiac medications before your next appointment, please call your pharmacy*   Lab Work: Your  physician recommends that you return for lab work in:  TODAY: BMET, Mag, CBC  If you have labs (blood work) drawn today and your tests are completely normal, you will receive your results only by: [place] (if you have MyChart) OR A paper copy in the mail If you have any lab test that is abnormal or we need to change your treatment, we will call you to review the results.   Testing/Procedures:  [facility] [address] [place] [address] Dept: [phone] Loc: [phone]  Charles Huynh  01/14/2022  You are scheduled for a Cardiac Catheterization on Wednesday, February 8 with Dr. Glenetta Hew.  1. Please arrive at the [facility] (Main Entrance A) at [facility]: [address] [place], [place] [address] at 8:00 AM (This time is two hours before your procedure to ensure your preparation). Free valet parking service is available.   Special note: Every effort is made to have your procedure done on time. Please understand that emergencies sometimes delay scheduled procedures.  2. Diet: Do not eat solid foods after midnight.  The patient may have clear liquids until 5am upon the day of the procedure.  3. Labs: You will need to have blood drawn on TODAY.  4. Medication instructions in preparation for your procedure:   Contrast Allergy: No  Stop taking Eliquis (  Apixiban) on Monday, February 6.  Do not take Diabetes Med Glucophage (Metformin) on the day of the procedure and HOLD 48 HOURS AFTER THE PROCEDURE.  On the morning of your procedure, take your Aspirin and any morning medicines NOT listed above.  You may use sips of water.  5. Plan for one night stay--bring personal belongings. 6. Bring a current list of your medications and current insurance cards. 7. You MUST have a responsible person to drive you home. 8. Someone MUST be with you the first 24 hours after you arrive  home or your discharge will be delayed. 9. Please wear clothes that are easy to get on and off and wear slip-on shoes.  Thank you for allowing [place] to care for you!   -- [facility]    Follow-Up: At [facility], you and your health needs are our priority.  As part of our continuing mission to provide you with exceptional heart care, we have created designated Provider Care Teams.  These Care Teams include your primary Cardiologist (physician) and Advanced Practice Providers (APPs -  Physician Assistants and Nurse Practitioners) who all work together to provide you with the care you need, when you need it.  We recommend signing up for the patient portal called "MyChart".  Sign up information is provided on this After Visit Summary.  MyChart is used to connect with patients for Virtual Visits (Telemedicine).  Patients are able to view lab/test results, encounter notes, upcoming appointments, etc.  Non-urgent messages can be sent to your provider as well.   To learn more about what you can do with MyChart, go to [email or web address].    Your next appointment:   2-4 week(s) after cath  The format for your next appointment:   In Person  Provider:   Berniece Salines, DO     Other Instructions     Adopting a Healthy Lifestyle.  Know what a healthy weight is for you (roughly BMI <25) and aim to maintain this   Aim for 7+ servings of fruits and vegetables daily   65-80+ fluid ounces of water or unsweet tea for healthy kidneys   Limit to max 1 drink of alcohol per day; avoid smoking/tobacco   Limit animal fats in diet for cholesterol and heart health - choose grass fed whenever available   Avoid highly processed foods, and foods high in saturated/trans fats   Aim for low stress - take time to unwind and care for your mental health   Aim for 150 min of moderate intensity exercise weekly for heart health, and weights twice weekly for bone health   Aim  for 7-9 hours of sleep daily   When it comes to diets, agreement about the perfect plan isnt easy to find, even among the experts. Experts at the [employer] developed an idea known as the Healthy Eating Plate. Just imagine a plate divided into logical, healthy portions.   The emphasis is on diet quality:   Load up on vegetables and fruits - one-half of your plate: Aim for color and variety, and remember that potatoes dont count.   Go for whole grains - one-quarter of your plate: Whole wheat, barley, wheat berries, quinoa, oats, brown rice, and foods made with them. If you want pasta, go with whole wheat pasta.   Protein power - one-quarter of your plate: Fish, chicken, beans, and nuts are all healthy, versatile protein sources. Limit red meat.   The diet, however, does  go beyond the plate, offering a few other suggestions.   Use healthy plant oils, such as olive, canola, soy, corn, sunflower and peanut. Check the labels, and avoid partially hydrogenated oil, which have unhealthy trans fats.   If youre thirsty, drink water. Coffee and tea are good in moderation, but skip sugary drinks and limit milk and dairy products to one or two daily servings.   The type of carbohydrate in the diet is more important than the amount. Some sources of carbohydrates, such as vegetables, fruits, whole grains, and beans-are healthier than others.   Finally, stay active  Signed, Berniece Salines, DO  01/14/2022 7:33 PM    [facility]

## 2022-01-14 NOTE — Progress Notes (Signed)
Cardiology Office Note:    Date:  01/14/2022   ID:  JERET BERL, DOB 12/19/1960, MRN [id number]  PCP:  Georganna Skeans, PA-C  Cardiologist:  Berniece Salines, DO  Electrophysiologist:  None   Referring MD: Georganna Skeans, PA-C   " I am having chest pain"  History of Present Illness:    Charles Huynh is a 61 y.o. male with a hx of moderate coronary artery disease on coronary CT scan, paroxysmal atrial fibrillation on Eliquis 5 mg twice daily, TIA, CVA is here today for follow-up visit.  I saw the patient November 2021 at that time he appeared to be doing well from a cardiovascular standpoint.  He was going through a lot with his family life.  There were no changes to his medication.  Continue all of his medicines.  In the interim the patient did have a hospitalization which was suspected that he may have had a seizure.  He did see Dr. Bettina Gavia back in March 2022 at that time of the a monitor was placed on the patient.,  He did wear the monitor which showed some runs of supraventricular tachycardia.  The patient tells me over the last several months he has had a very hard time.  He has had increasing episodes of chest discomfort.  He describes it as a midsternal pain and feels like elephant sitting on his chest.  He has been taking nitroglycerin every day.  He also has been taking all of his medications which includes Imdur for his antianginal.  He is concerned because now he feels as if this is getting worse.  His wife who is with him in the room tells me that he is waking up from sleep with the symptoms.  Most times it is a pressure-like sensation radiates over to his shoulder.  He has not had to go up to his jaw.  There are times some associated shortness of breath.   Past Medical History:  Diagnosis Date   Anxiety    Atrial fibrillation (HCC)    Barrett esophagus    Benign prostatic hyperplasia with urinary obstruction 12/28/2015   Cerebrovascular accident ([place]) 07/09/2017   Chronic pain of  both shoulders 04/08/2021   Chronic pain syndrome 07/09/2017   Clotting disorder ([place])    Coronary artery disease of native artery of native heart with stable angina pectoris ([place]) 01/03/2020   Decreased testosterone level 07/09/2017   Depression    Diabetes ([place])    Dyslipidemia 03/11/2021   Dysphagia 03/11/2021   Dyspnea 03/11/2021   Essential hypertension 08/30/2019   Fatigue 03/11/2021   Gastroesophageal reflux disease without esophagitis 09/02/2017   Gastroparesis 01/24/2021   Heart disease    History of ulcer disease    Hx pulmonary embolism    Hyperlipidemia    Hypertension    Hypogonadism in male 03/11/2021   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 02/06/2021   Hypothyroidism 07/09/2017   Intermittent palpitations 03/11/2021   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/24/2021   Mixed hyperlipidemia 01/03/2020   Nausea 03/11/2021   Nicotine dependence 03/11/2021   PAF (paroxysmal atrial fibrillation) ([place]) 01/03/2020   Parkinson's disease ([place])    Shoulder pain 03/11/2021   Smoker 03/11/2021   Spasm of back muscles 03/11/2021   Stroke [facility])    Thyroid disease    Type 2 diabetes mellitus without complication ([place]) 99991111   Wound of skin 03/11/2021    Past Surgical History:  Procedure Laterality Date   BACK SURGERY  CARDIAC CATHETERIZATION     COLONOSCOPY  2019   have a small polyp [place] Dr Derry Lory   ESOPHAGOGASTRODUODENOSCOPY  2019   Dr Derry Lory. [place]   KNEE ARTHROSCOPY Left    NECK SURGERY     SPINAL CORD STIMULATOR INSERTION     WRIST SURGERY Bilateral     Current Medications: Current Meds  Medication Sig   albuterol (VENTOLIN HFA) 108 (90 Base) MCG/ACT inhaler Inhale 2 puffs into the lungs every 4 (four) hours as needed.   baclofen (LIORESAL) 10 MG tablet Take 10 mg by mouth 2 (two) times daily.   carbidopa-levodopa (SINEMET CR) 50-200 MG tablet Take 2 tablets by mouth 3 (three) times daily.   citalopram (CELEXA) 40 MG tablet Take 40 mg by  mouth daily.   D3-50 1.25 MG (50000 UT) capsule Take 50,000 Units by mouth once a week.   DILT-XR 120 MG 24 hr capsule Take 120 mg by mouth daily.   ELIQUIS 5 MG TABS tablet Take 1 tablet (5 mg total) by mouth 2 (two) times daily.   esomeprazole (NEXIUM) 40 MG capsule Take 40 mg by mouth daily at 12 noon.   fentaNYL (DURAGESIC) 25 MCG/HR as directed. Apply every 2.5 days   hydrochlorothiazide (MICROZIDE) 12.5 MG capsule Take 1 capsule (12.5 mg total) by mouth daily.   isosorbide mononitrate (IMDUR) 60 MG 24 hr tablet Take 1 tablet (60 mg total) by mouth daily. Needs appointment for future refills / 1st attempt   JANUVIA 100 MG tablet Take 100 mg by mouth daily.   JARDIANCE 25 MG TABS tablet Take 25 mg by mouth daily.   levothyroxine (SYNTHROID) 75 MCG tablet Take 75 mcg by mouth daily.   metFORMIN (GLUCOPHAGE-XR) 500 MG 24 hr tablet Take 1,000 mg by mouth 2 (two) times daily.   metoCLOPramide (REGLAN) 10 MG tablet TK 1 T PO BID   metoprolol tartrate (LOPRESSOR) 100 MG tablet Take 1 tablet (100 mg total) by mouth 2 (two) times daily.   NUPLAZID 34 MG CAPS Take 1 capsule by mouth daily.   oxyCODONE (ROXICODONE) 15 MG immediate release tablet Take 15 mg by mouth 5 (five) times daily as needed for pain.   potassium chloride (KLOR-CON) 10 MEQ tablet Take 1 tablet by mouth daily   pregabalin (LYRICA) 300 MG capsule Take 300 mg by mouth 2 (two) times daily.   promethazine (PHENERGAN) 25 MG tablet Take 25 mg by mouth every 6 (six) hours as needed for nausea or vomiting.   ranolazine (RANEXA) 500 MG 12 hr tablet Take 1 tablet (500 mg total) by mouth 2 (two) times daily.   rosuvastatin (CRESTOR) 20 MG tablet Take 1 tablet (20 mg total) by mouth daily.   testosterone cypionate (DEPOTESTOSTERONE CYPIONATE) 200 MG/ML injection Inject 200 mg into the muscle every 14 (fourteen) days.   TRESIBA FLEXTOUCH 100 UNIT/ML FlexTouch Pen Inject 7 Units into the skin at bedtime.   [DISCONTINUED] lovastatin (MEVACOR)  20 MG tablet Take 1 tablet by mouth daily.   [DISCONTINUED] nitroGLYCERIN (NITROSTAT) 0.4 MG SL tablet PLACE 1 TABLET UNDER THE TONGUE EVERY 5 MINUTES AS NEEDED FOR CHEST PAIN     Allergies:   Naproxen sodium   Social History   Socioeconomic History   Marital status: Married    Spouse name: Not on file   Number of children: Not on file   Years of education: Not on file   Highest education level: Not on file  Occupational History   Not on  file  Tobacco Use   Smoking status: Every Day    Packs/day: 1.00    Years: 47.00    Pack years: 47.00    Types: Cigarettes   Smokeless tobacco: Never  Vaping Use   Vaping Use: Never used  Substance and Sexual Activity   Alcohol use: Not Currently    Comment: very seldom; 1 -2 times per year   Drug use: Never   Sexual activity: Not on file  Other Topics Concern   Not on file  Social History Narrative   Not on file   Social Determinants of Health   Financial Resource Strain: Not on file  Food Insecurity: Not on file  Transportation Needs: Not on file  Physical Activity: Not on file  Stress: Not on file  Social Connections: Not on file     Family History: The patient's family history includes Emphysema in his father; Heart attack in his father; Heart disease in his father; Lung cancer in his mother. There is no history of Colon cancer, Esophageal cancer, Stomach cancer, or Rectal cancer.  ROS:   Review of Systems  Constitution: Negative for decreased appetite, fever and weight gain.  HENT: Negative for congestion, ear discharge, hoarse voice and sore throat.   Eyes: Negative for discharge, redness, vision loss in right eye and visual halos.  Cardiovascular: Negative for chest pain, dyspnea on exertion, leg swelling, orthopnea and palpitations.  Respiratory: Negative for cough, hemoptysis, shortness of breath and snoring.   Endocrine: Negative for heat intolerance and polyphagia.  Hematologic/Lymphatic: Negative for bleeding  problem. Does not bruise/bleed easily.  Skin: Negative for flushing, nail changes, rash and suspicious lesions.  Musculoskeletal: Negative for arthritis, joint pain, muscle cramps, myalgias, neck pain and stiffness.  Gastrointestinal: Negative for abdominal pain, bowel incontinence, diarrhea and excessive appetite.  Genitourinary: Negative for decreased libido, genital sores and incomplete emptying.  Neurological: Negative for brief paralysis, focal weakness, headaches and loss of balance.  Psychiatric/Behavioral: Negative for altered mental status, depression and suicidal ideas.  Allergic/Immunologic: Negative for HIV exposure and persistent infections.    EKGs/Labs/Other Studies Reviewed:    The following studies were reviewed today:   EKG:  The ekg ordered today demonstrates   [facility] 10/26/2020 Zio Monitor The patient wore the monitor for 7 days. Indication: Paroxysmal atrial fibrillation. The minimal HR was 59 bpm, with maximal HR of 164 bpm, and average HR of 76 bpm. Predominant underlying rhythm was Sinus Rhythm. Bundle Branch Block/IVCD was present.  QRS morphology changes were present throughout recording.  4 Supraventricular Tachycardia runs occurred, the run with the fastest interval lasting 10 beats with a max rate of 164 bpm, the longest lasting 12 beats with an average rate of 139bpm.  The supraventricular tachycardia is likely atrial tachycardia. Supraventricular Tachycardia was associated with of symptomatic patient triggered event. Premature atrial complexes were rare (<1.0%). Premature ventricular complexes were rare (<1.0%), No AV block, no pauses, no atrial fibrillation.   Transthoracic echocardiogram 10/31/2019 IMPRESSIONS   1. Left ventricular ejection fraction, by visual estimation, is 60 to 65%. The left ventricle has normal function. Left ventricular septal wall thickness was severely increased. Mildly increased left ventricular posterior wall thickness. There is   asymetrical left ventricular hypertrophy with sigmoidal basal septum thickness.  2. Left ventricular diastolic parameters are consistent with Grade II diastolic dysfunction (pseudonormalization).  3. Global right ventricle has normal systolic function.The right ventricular size is normal. No increase in right ventricular wall thickness.  4. Left atrial size was normal.  5. Right atrial size was normal.  6. Mild mitral annular calcification.  7. The mitral valve is normal in structure. Trace mitral valve regurgitation. No evidence of mitral stenosis.  8. The tricuspid valve is normal in structure. Tricuspid valve regurgitation is not demonstrated.  9. The pulmonic valve was normal in structure. Pulmonic valve regurgitation is not visualized. 10. The aortic valve is normal in structure. Aortic valve regurgitation is not visualized. No evidence of aortic valve sclerosis or stenosis. 11. Normal pulmonary artery systolic pressure.   Coronary CTA 10/12/2019 Coronary calcium score: The patient's coronary artery calcium score is 50.35, which places the patient in the 57th percentile. Calcium noted in the LAD and LCx.   Coronary arteries: Normal coronary origins.  Left dominance.   Right Coronary Artery: Dominant vessel. No significant stenosis - mis-registration artifact in the mid-vessel.   Left Main Coronary Artery: Appears normal. Bifurcates into the LAD and LCx vessels.   Left Anterior Descending Coronary Artery: Coarses anteriorly and gives off a large high diagonal branch, neither branch reaches the apex. There is minimal 1-24% proximal mixed stenosis (CADRADS1).   Left Circumflex Artery: The AV groove LCX demonstrates no significant disease. There is a large OM1 branch with multiple sub-branches. A moderate 50-69% mixed stenosis (CADRADS3) of the proximal OM is noted.   Aorta: Normal size, 26 mm at the mid ascending aorta (level of the PA bifurcation) measured double oblique. No  calcifications. No Dissection. IMPRESSION: 1. Moderate stenosis of a large OM branch and minimal LAD disease, CADRADS = 3. CT FFR will be performed and reported separately.   2. Coronary calcium score of 50.35. This was 57th percentile for age and sex matched control.   3. Normal coronary origin with left dominance.   4. Mildly dilated main PA at 30 mm.  Recent Labs: 01/16/2021: ALT 3 01/14/2022: BUN 22; Creatinine, Ser 0.97; Hemoglobin 16.7; Magnesium 2.2; Platelets 219; Potassium 4.4; Sodium 133  Recent Lipid Panel No results found for: CHOL, TRIG, HDL, CHOLHDL, VLDL, LDLCALC, LDLDIRECT  Physical Exam:    VS:  BP 120/70 (BP Location: Left Arm, Patient Position: Sitting)    Pulse 79    Ht 5\' 10"  (1.778 m)    Wt 171 lb 3.2 oz (77.7 kg)    SpO2 95%    BMI 24.56 kg/m     Wt Readings from Last 3 Encounters:  01/14/22 171 lb 3.2 oz (77.7 kg)  03/11/21 171 lb 12.8 oz (77.9 kg)  01/29/21 177 lb (80.3 kg)     GEN: Well nourished, well developed in no acute distress HEENT: Normal NECK: No JVD; No carotid bruits LYMPHATICS: No lymphadenopathy CARDIAC: S1S2 noted,RRR, no murmurs, rubs, gallops RESPIRATORY:  Clear to auscultation without rales, wheezing or rhonchi  ABDOMEN: Soft, non-tender, non-distended, +bowel sounds, no guarding. EXTREMITIES: No edema, No cyanosis, no clubbing MUSCULOSKELETAL:  No deformity  SKIN: Warm and dry NEUROLOGIC:  Alert and oriented x 3, non-focal PSYCHIATRIC:  Normal affect, good insight  ASSESSMENT:    1. Coronary artery disease of native artery of native heart with stable angina pectoris ([place])   2. Medication management   3. Chest pain of uncertain etiology   4. Paroxysmal atrial fibrillation (HCC)   5. Type 2 diabetes mellitus without complication, without long-term current use of insulin ([place])   6. Mixed hyperlipidemia   7. Smoker    PLAN:    His symptoms are concerning and appears to be worsening.  I will add Ranexa 500 mg to his  medication  regimen and plan to refer the patient for heart catheterization as he is high risk for progression of his coronary artery disease.  The patient understands that risks include but are not limited to stroke (1 in 1000), death (1 in 8), kidney failure [usually temporary] (1 in 500), bleeding (1 in 200), allergic reaction [possibly serious] (1 in 200), and agrees to proceed.  Blood pressure is acceptable, continue with current antihypertensive regimen.  Hyperlipidemia - continue with current statin medication.  His diabetes mellitus is being managed by his primary doctor.  Smoking cessation advised  The patient is in agreement with the above plan. The patient left the office in stable condition.  The patient will follow up in 4 weeks or sooner if needed.   Medication Adjustments/Labs and Tests Ordered: Current medicines are reviewed at length with the patient today.  Concerns regarding medicines are outlined above.  Orders Placed This Encounter  Procedures   Basic Metabolic Panel (BMET)   CBC with Differential/Platelet   Magnesium   EKG 12-Lead   Meds ordered this encounter  Medications   nitroGLYCERIN (NITROSTAT) 0.4 MG SL tablet    Sig: PLACE 1 TABLET UNDER THE TONGUE EVERY 5 MINUTES AS NEEDED FOR CHEST PAIN    Dispense:  25 tablet    Refill:  6   ranolazine (RANEXA) 500 MG 12 hr tablet    Sig: Take 1 tablet (500 mg total) by mouth 2 (two) times daily.    Dispense:  180 tablet    Refill:  3   rosuvastatin (CRESTOR) 20 MG tablet    Sig: Take 1 tablet (20 mg total) by mouth daily.    Dispense:  90 tablet    Refill:  3    Patient Instructions  Medication Instructions:  Your physician has recommended you make the following change in your medication:  STOP: Lovastatin (Mevacor) START: Crestor 20 mg once daily START: Ranexa 500 mg twice daily  *If you need a refill on your cardiac medications before your next appointment, please call your pharmacy*   Lab Work: Your  physician recommends that you return for lab work in:  TODAY: BMET, Mag, CBC  If you have labs (blood work) drawn today and your tests are completely normal, you will receive your results only by: [place] (if you have MyChart) OR A paper copy in the mail If you have any lab test that is abnormal or we need to change your treatment, we will call you to review the results.   Testing/Procedures:  [address] [place] [address] Dept: [phone] Loc: [phone]  SANJITH GOSTOMSKI  01/14/2022  You are scheduled for a Cardiac Catheterization on Wednesday, February 8 with Dr. Glenetta Hew.  1. Please arrive at the [facility] (Main Entrance A) at [facility]: [address] [place], [place] [address] at 8:00 AM (This time is two hours before your procedure to ensure your preparation). Free valet parking service is available.   Special note: Every effort is made to have your procedure done on time. Please understand that emergencies sometimes delay scheduled procedures.  2. Diet: Do not eat solid foods after midnight.  The patient may have clear liquids until 5am upon the day of the procedure.  3. Labs: You will need to have blood drawn on TODAY.  4. Medication instructions in preparation for your procedure:   Contrast Allergy: No  Stop taking Eliquis (  Apixiban) on Monday, February 6.  Do not take Diabetes Med Glucophage (Metformin) on the day of the procedure and HOLD 48 HOURS AFTER THE PROCEDURE.  On the morning of your procedure, take your Aspirin and any morning medicines NOT listed above.  You may use sips of water.  5. Plan for one night stay--bring personal belongings. 6. Bring a current list of your medications and current insurance cards. 7. You MUST have a responsible person to drive you home. 8. Someone MUST be with you the first 24 hours after you arrive  home or your discharge will be delayed. 9. Please wear clothes that are easy to get on and off and wear slip-on shoes.  Thank you for allowing [place] to care for you!   -- [facility]    Follow-Up: At [facility], you and your health needs are our priority.  As part of our continuing mission to provide you with exceptional heart care, we have created designated Provider Care Teams.  These Care Teams include your primary Cardiologist (physician) and Advanced Practice Providers (APPs -  Physician Assistants and Nurse Practitioners) who all work together to provide you with the care you need, when you need it.  We recommend signing up for the patient portal called "MyChart".  Sign up information is provided on this After Visit Summary.  MyChart is used to connect with patients for Virtual Visits (Telemedicine).  Patients are able to view lab/test results, encounter notes, upcoming appointments, etc.  Non-urgent messages can be sent to your provider as well.   To learn more about what you can do with MyChart, go to [email or web address].    Your next appointment:   2-4 week(s) after cath  The format for your next appointment:   In Person  Provider:   Berniece Salines, DO     Other Instructions     Adopting a Healthy Lifestyle.  Know what a healthy weight is for you (roughly BMI <25) and aim to maintain this   Aim for 7+ servings of fruits and vegetables daily   65-80+ fluid ounces of water or unsweet tea for healthy kidneys   Limit to max 1 drink of alcohol per day; avoid smoking/tobacco   Limit animal fats in diet for cholesterol and heart health - choose grass fed whenever available   Avoid highly processed foods, and foods high in saturated/trans fats   Aim for low stress - take time to unwind and care for your mental health   Aim for 150 min of moderate intensity exercise weekly for heart health, and weights twice weekly for bone health   Aim  for 7-9 hours of sleep daily   When it comes to diets, agreement about the perfect plan isnt easy to find, even among the experts. Experts at the [employer] developed an idea known as the Healthy Eating Plate. Just imagine a plate divided into logical, healthy portions.   The emphasis is on diet quality:   Load up on vegetables and fruits - one-half of your plate: Aim for color and variety, and remember that potatoes dont count.   Go for whole grains - one-quarter of your plate: Whole wheat, barley, wheat berries, quinoa, oats, brown rice, and foods made with them. If you want pasta, go with whole wheat pasta.   Protein power - one-quarter of your plate: Fish, chicken, beans, and nuts are all healthy, versatile protein sources. Limit red meat.   The diet, however, does  go beyond the plate, offering a few other suggestions.   Use healthy plant oils, such as olive, canola, soy, corn, sunflower and peanut. Check the labels, and avoid partially hydrogenated oil, which have unhealthy trans fats.   If youre thirsty, drink water. Coffee and tea are good in moderation, but skip sugary drinks and limit milk and dairy products to one or two daily servings.   The type of carbohydrate in the diet is more important than the amount. Some sources of carbohydrates, such as vegetables, fruits, whole grains, and beans-are healthier than others.   Finally, stay active  Signed, Berniece Salines, DO  01/14/2022 7:33 PM    [facility]

## 2022-01-14 NOTE — Patient Instructions (Addendum)
Medication Instructions:  Your physician has recommended you make the following change in your medication:  STOP: Lovastatin (Mevacor) START: Crestor 20 mg once daily START: Ranexa 500 mg twice daily  *If you need a refill on your cardiac medications before your next appointment, please call your pharmacy*   Lab Work: Your physician recommends that you return for lab work in:  TODAY: BMET, Mag, CBC  If you have labs (blood work) drawn today and your tests are completely normal, you will receive your results only by: MyChart Message (if you have MyChart) OR A paper copy in the mail If you have any lab test that is abnormal or we need to change your treatment, we will call you to review the results.   Testing/Procedures:  [facility] [address] [place] [address] [place] [address] Dept: [phone] Loc: [phone]  Charles Huynh  01/14/2022  You are scheduled for a Cardiac Catheterization on Wednesday, February 8 with Dr. Bryan Lemma.  1. Please arrive at the [facility] (Main Entrance A) at [facility]: [address] [place], [place] [address] at 8:00 AM (This time is two hours before your procedure to ensure your preparation). Free valet parking service is available.   Special note: Every effort is made to have your procedure done on time. Please understand that emergencies sometimes delay scheduled procedures.  2. Diet: Do not eat solid foods after midnight.  The patient may have clear liquids until 5am upon the day of the procedure.  3. Labs: You will need to have blood drawn on TODAY.  4. Medication instructions in preparation for your procedure:   Contrast Allergy: No  Stop taking Eliquis (Apixiban) on Monday, February 6.  Do not take Diabetes Med Glucophage (Metformin) on the day of the procedure and HOLD 48 HOURS AFTER THE PROCEDURE.  On the morning of your procedure, take your  Aspirin and any morning medicines NOT listed above.  You may use sips of water.  5. Plan for one night stay--bring personal belongings. 6. Bring a current list of your medications and current insurance cards. 7. You MUST have a responsible person to drive you home. 8. Someone MUST be with you the first 24 hours after you arrive home or your discharge will be delayed. 9. Please wear clothes that are easy to get on and off and wear slip-on shoes.  Thank you for allowing [place] to care for you!   -- [facility]    Follow-Up: At [facility], you and your health needs are our priority.  As part of our continuing mission to provide you with exceptional heart care, we have created designated Provider Care Teams.  These Care Teams include your primary Cardiologist (physician) and Advanced Practice Providers (APPs -  Physician Assistants and Nurse Practitioners) who all work together to provide you with the care you need, when you need it.  We recommend signing up for the patient portal called "MyChart".  Sign up information is provided on this After Visit Summary.  MyChart is used to connect with patients for Virtual Visits (Telemedicine).  Patients are able to view lab/test results, encounter notes, upcoming appointments, etc.  Non-urgent messages can be sent to your provider as well.   To learn more about what you can do with MyChart, go to [email or web address].    Your next appointment:   2-4 week(s) after cath  The format for your next appointment:  In Person  Provider:   Thomasene Ripple, DO     Other Instructions

## 2022-01-21 ENCOUNTER — Telehealth: Payer: Self-pay | Admitting: *Deleted

## 2022-01-21 NOTE — Telephone Encounter (Signed)
Reviewed procedure instructions with patient.  

## 2022-01-21 NOTE — Telephone Encounter (Addendum)
Cardiac catheterization scheduled at [facility] for: Wednesday January 22, 2022 10 AM [facility] Main Entrance A [facility]) at: 8 AM   Diet-no solid food after midnight prior to cath, clear liquids until 5 AM day of procedure.  Medication instructions for procedure: -Hold:  Eliquis-none 01/20/22 until post procedure per instructions  HCTZ/KCl-AM of procedure  Metformin-day of procedure and 48 hours post procedure  Jardiance/Jardiance -AM of procedure  Treshiba-1/2 usual dose HS prior to procedure -Except hold medications usual morning medications can be taken pre-cath with sips of water including aspirin 81 mg-pt reports he does tolerate aspirin.    Must have responsible adult to drive home post procedure and be with patient first 24 hours after arriving home.  [facility] does allow one visitor to accompany you and wait in the hospital waiting room while you are there for your procedure. You and your visitor will be asked to wear a mask once you enter the hospital.   Patient reports does not currently have any new symptoms concerning for COVID-19 and no household members with COVID-19 like illness.          Reviewed procedure instructions with patient.

## 2022-01-21 NOTE — Telephone Encounter (Signed)
Patient's wife returned your call

## 2022-01-22 ENCOUNTER — Encounter (HOSPITAL_COMMUNITY): Payer: Self-pay | Admitting: Cardiology

## 2022-01-22 ENCOUNTER — Encounter (HOSPITAL_COMMUNITY)
Admission: RE | Disposition: A | Discharge status: Home / Self Care | Payer: Self-pay | Source: Home / Self Care | Attending: Cardiology

## 2022-01-22 ENCOUNTER — Ambulatory Visit (HOSPITAL_COMMUNITY)
Admission: RE | Admit: 2022-01-22 | Discharge: 2022-01-22 | Disposition: A | Discharge status: Home / Self Care | Payer: Medicare Other | Attending: Cardiology | Admitting: Cardiology

## 2022-01-22 ENCOUNTER — Other Ambulatory Visit: Payer: Self-pay

## 2022-01-22 DIAGNOSIS — F17210 Nicotine dependence, cigarettes, uncomplicated: Secondary | ICD-10-CM | POA: Diagnosis not present

## 2022-01-22 DIAGNOSIS — I25118 Atherosclerotic heart disease of native coronary artery with other forms of angina pectoris: Secondary | ICD-10-CM

## 2022-01-22 DIAGNOSIS — E782 Mixed hyperlipidemia: Secondary | ICD-10-CM | POA: Diagnosis not present

## 2022-01-22 DIAGNOSIS — I25709 Atherosclerosis of coronary artery bypass graft(s), unspecified, with unspecified angina pectoris: Secondary | ICD-10-CM | POA: Diagnosis present

## 2022-01-22 DIAGNOSIS — I25110 Atherosclerotic heart disease of native coronary artery with unstable angina pectoris: Secondary | ICD-10-CM | POA: Diagnosis present

## 2022-01-22 DIAGNOSIS — K3184 Gastroparesis: Secondary | ICD-10-CM | POA: Insufficient documentation

## 2022-01-22 DIAGNOSIS — Z794 Long term (current) use of insulin: Secondary | ICD-10-CM | POA: Insufficient documentation

## 2022-01-22 DIAGNOSIS — Z7901 Long term (current) use of anticoagulants: Secondary | ICD-10-CM | POA: Insufficient documentation

## 2022-01-22 DIAGNOSIS — I480 Paroxysmal atrial fibrillation: Secondary | ICD-10-CM | POA: Diagnosis present

## 2022-01-22 DIAGNOSIS — Z8673 Personal history of transient ischemic attack (TIA), and cerebral infarction without residual deficits: Secondary | ICD-10-CM | POA: Diagnosis not present

## 2022-01-22 DIAGNOSIS — E1143 Type 2 diabetes mellitus with diabetic autonomic (poly)neuropathy: Secondary | ICD-10-CM | POA: Insufficient documentation

## 2022-01-22 DIAGNOSIS — Z7984 Long term (current) use of oral hypoglycemic drugs: Secondary | ICD-10-CM | POA: Insufficient documentation

## 2022-01-22 HISTORY — PX: CORONARY PRESSURE/FFR STUDY: CATH118243

## 2022-01-22 HISTORY — PX: LEFT HEART CATH AND CORONARY ANGIOGRAPHY: CATH118249

## 2022-01-22 LAB — POCT ACTIVATED CLOTTING TIME: Activated Clotting Time: 245 seconds

## 2022-01-22 LAB — GLUCOSE, CAPILLARY: Glucose-Capillary: 225 mg/dL — ABNORMAL HIGH (ref 70–99)

## 2022-01-22 SURGERY — LEFT HEART CATH AND CORONARY ANGIOGRAPHY
Anesthesia: LOCAL

## 2022-01-22 MED ORDER — LABETALOL HCL 5 MG/ML IV SOLN: 10.0000 mg | INTRAVENOUS | Status: DC | PRN

## 2022-01-22 MED ORDER — SODIUM CHLORIDE 0.9% FLUSH: 3.0000 mL | INTRAVENOUS | Status: DC | PRN

## 2022-01-22 MED ORDER — SODIUM CHLORIDE 0.9% FLUSH: 3.0000 mL | Freq: Two times a day (BID) | INTRAVENOUS | Status: DC

## 2022-01-22 MED ORDER — SODIUM CHLORIDE 0.9 % IV SOLN: 250.0000 mL | INTRAVENOUS | Status: DC | PRN

## 2022-01-22 MED ORDER — MIDAZOLAM HCL 2 MG/2ML IJ SOLN
INTRAMUSCULAR | Status: DC | PRN
  Administered 2022-01-22: 1 mg via INTRAVENOUS

## 2022-01-22 MED ORDER — VERAPAMIL HCL 2.5 MG/ML IV SOLN
INTRAVENOUS | Status: DC | PRN
  Administered 2022-01-22: 10 mL via INTRA_ARTERIAL

## 2022-01-22 MED ORDER — SODIUM CHLORIDE 0.9 % WEIGHT BASED INFUSION
3.0000 mL/kg/h | INTRAVENOUS | Status: AC
  Administered 2022-01-22: 3 mL/kg/h via INTRAVENOUS

## 2022-01-22 MED ORDER — SODIUM CHLORIDE 0.9 % IV SOLN: INTRAVENOUS | Status: DC

## 2022-01-22 MED ORDER — FENTANYL CITRATE (PF) 100 MCG/2ML IJ SOLN
INTRAMUSCULAR | Status: DC | PRN
  Administered 2022-01-22: 25 ug via INTRAVENOUS

## 2022-01-22 MED ORDER — HEPARIN SODIUM (PORCINE) 1000 UNIT/ML IJ SOLN
INTRAMUSCULAR | Status: DC | PRN
  Administered 2022-01-22: 6000 [IU] via INTRAVENOUS
  Administered 2022-01-22: 2000 [IU] via INTRAVENOUS

## 2022-01-22 MED ORDER — NITROGLYCERIN 1 MG/10 ML FOR IR/CATH LAB
INTRA_ARTERIAL | Status: AC
  Filled 2022-01-22: qty 10

## 2022-01-22 MED ORDER — LIDOCAINE HCL (PF) 1 % IJ SOLN
INTRAMUSCULAR | Status: AC
  Filled 2022-01-22: qty 30

## 2022-01-22 MED ORDER — ASPIRIN 81 MG PO CHEW: 81.0000 mg | CHEWABLE_TABLET | ORAL | Status: DC

## 2022-01-22 MED ORDER — MIDAZOLAM HCL 2 MG/2ML IJ SOLN
INTRAMUSCULAR | Status: AC
  Filled 2022-01-22: qty 2

## 2022-01-22 MED ORDER — HEPARIN (PORCINE) IN NACL 1000-0.9 UT/500ML-% IV SOLN
INTRAVENOUS | Status: AC
  Filled 2022-01-22: qty 500

## 2022-01-22 MED ORDER — LIDOCAINE HCL (PF) 1 % IJ SOLN
INTRAMUSCULAR | Status: DC | PRN
  Administered 2022-01-22: 2 mL

## 2022-01-22 MED ORDER — ONDANSETRON HCL 4 MG/2ML IJ SOLN: 4.0000 mg | Freq: Four times a day (QID) | INTRAMUSCULAR | Status: DC | PRN

## 2022-01-22 MED ORDER — HYDRALAZINE HCL 20 MG/ML IJ SOLN: 10.0000 mg | INTRAMUSCULAR | Status: DC | PRN

## 2022-01-22 MED ORDER — NITROGLYCERIN 1 MG/10 ML FOR IR/CATH LAB
INTRA_ARTERIAL | Status: DC | PRN
  Administered 2022-01-22: 200 ug via INTRACORONARY

## 2022-01-22 MED ORDER — FENTANYL CITRATE (PF) 100 MCG/2ML IJ SOLN
INTRAMUSCULAR | Status: AC
  Filled 2022-01-22: qty 2

## 2022-01-22 MED ORDER — VERAPAMIL HCL 2.5 MG/ML IV SOLN
INTRAVENOUS | Status: AC
  Filled 2022-01-22: qty 2

## 2022-01-22 MED ORDER — SODIUM CHLORIDE 0.9 % WEIGHT BASED INFUSION: 1.0000 mL/kg/h | INTRAVENOUS | Status: DC

## 2022-01-22 MED ORDER — ACETAMINOPHEN 325 MG PO TABS: 650.0000 mg | ORAL_TABLET | ORAL | Status: DC | PRN

## 2022-01-22 MED ORDER — HEPARIN (PORCINE) IN NACL 1000-0.9 UT/500ML-% IV SOLN
INTRAVENOUS | Status: DC | PRN
  Administered 2022-01-22 (×2): 500 mL

## 2022-01-22 SURGICAL SUPPLY — 12 items
CATH OPTITORQUE TIG 4.0 5F (CATHETERS) ×1 IMPLANT
CATH VISTA GUIDE 6FR XB3.5 (CATHETERS) ×1 IMPLANT
DEVICE RAD COMP TR BAND LRG (VASCULAR PRODUCTS) ×1 IMPLANT
GLIDESHEATH SLEND SS 6F .021 (SHEATH) ×1 IMPLANT
GUIDEWIRE INQWIRE 1.5J.035X260 (WIRE) IMPLANT
GUIDEWIRE PRESSURE X 175 (WIRE) ×1 IMPLANT
INQWIRE 1.5J .035X260CM (WIRE) ×2
KIT ESSENTIALS PG (KITS) ×1 IMPLANT
KIT HEART LEFT (KITS) ×2 IMPLANT
PACK CARDIAC CATHETERIZATION (CUSTOM PROCEDURE TRAY) ×2 IMPLANT
TRANSDUCER W/STOPCOCK (MISCELLANEOUS) ×2 IMPLANT
TUBING CIL FLEX 10 FLL-RA (TUBING) ×2 IMPLANT

## 2022-01-22 NOTE — Interval H&P Note (Signed)
History and Physical Interval Note:  01/22/2022 10:16 AM  Charles Huynh  has presented today for surgery, with the diagnosis of cad -with progressive angina.  The various methods of treatment have been discussed with the patient and family. After consideration of risks, benefits and other options for treatment, the patient has consented to  Procedure(s): LEFT HEART CATH AND CORONARY ANGIOGRAPHY (N/A)  PERCUTANEOUS CORONARY INTERVENTION   as a surgical intervention.  The patient's history has been reviewed, patient examined, no change in status, stable for surgery.  I have reviewed the patient's chart and labs.  Questions were answered to the patient's satisfaction.    Cath Lab Visit (complete for each Cath Lab visit)  Clinical Evaluation Leading to the Procedure:   ACS: No.  Non-ACS:    Anginal Classification: CCS IV  Anti-ischemic medical therapy: Maximal Therapy (2 or more classes of medications)  Non-Invasive Test Results: Equivocal test results 2.5  years ago, nonischemic Coronary CTA, but now with progressive symptoms  Prior CABG: No previous CABG    Glenetta Hew

## 2022-01-23 ENCOUNTER — Emergency Department (HOSPITAL_BASED_OUTPATIENT_CLINIC_OR_DEPARTMENT_OTHER): Payer: Medicare Other

## 2022-01-23 ENCOUNTER — Other Ambulatory Visit: Payer: Self-pay

## 2022-01-23 ENCOUNTER — Emergency Department (HOSPITAL_BASED_OUTPATIENT_CLINIC_OR_DEPARTMENT_OTHER)
Admission: EM | Admit: 2022-01-23 | Discharge: 2022-01-23 | Disposition: A | Discharge status: Home / Self Care | Payer: Medicare Other | Attending: Emergency Medicine | Admitting: Emergency Medicine

## 2022-01-23 ENCOUNTER — Encounter (HOSPITAL_BASED_OUTPATIENT_CLINIC_OR_DEPARTMENT_OTHER): Payer: Self-pay

## 2022-01-23 ENCOUNTER — Telehealth: Payer: Self-pay | Admitting: Cardiology

## 2022-01-23 DIAGNOSIS — G20 Parkinson's disease: Secondary | ICD-10-CM | POA: Diagnosis not present

## 2022-01-23 DIAGNOSIS — F17200 Nicotine dependence, unspecified, uncomplicated: Secondary | ICD-10-CM | POA: Diagnosis not present

## 2022-01-23 DIAGNOSIS — Z7901 Long term (current) use of anticoagulants: Secondary | ICD-10-CM | POA: Diagnosis not present

## 2022-01-23 DIAGNOSIS — I4891 Unspecified atrial fibrillation: Secondary | ICD-10-CM | POA: Insufficient documentation

## 2022-01-23 DIAGNOSIS — R5383 Other fatigue: Secondary | ICD-10-CM | POA: Insufficient documentation

## 2022-01-23 DIAGNOSIS — Z20822 Contact with and (suspected) exposure to covid-19: Secondary | ICD-10-CM | POA: Insufficient documentation

## 2022-01-23 DIAGNOSIS — R519 Headache, unspecified: Secondary | ICD-10-CM | POA: Insufficient documentation

## 2022-01-23 DIAGNOSIS — R059 Cough, unspecified: Secondary | ICD-10-CM | POA: Insufficient documentation

## 2022-01-23 DIAGNOSIS — D72829 Elevated white blood cell count, unspecified: Secondary | ICD-10-CM | POA: Insufficient documentation

## 2022-01-23 LAB — CBC WITH DIFFERENTIAL/PLATELET
Abs Immature Granulocytes: 0.04 10*3/uL (ref 0.00–0.07)
Basophils Absolute: 0.1 10*3/uL (ref 0.0–0.1)
Basophils Relative: 1 %
Eosinophils Absolute: 0.2 10*3/uL (ref 0.0–0.5)
Eosinophils Relative: 2 %
HCT: 50 % (ref 39.0–52.0)
Hemoglobin: 17.4 g/dL — ABNORMAL HIGH (ref 13.0–17.0)
Immature Granulocytes: 0 %
Lymphocytes Relative: 27 %
Lymphs Abs: 3.1 10*3/uL (ref 0.7–4.0)
MCH: 30.2 pg (ref 26.0–34.0)
MCHC: 34.8 g/dL (ref 30.0–36.0)
MCV: 86.8 fL (ref 80.0–100.0)
Monocytes Absolute: 0.7 10*3/uL (ref 0.1–1.0)
Monocytes Relative: 6 %
Neutro Abs: 7.5 10*3/uL (ref 1.7–7.7)
Neutrophils Relative %: 64 %
Platelets: 239 10*3/uL (ref 150–400)
RBC: 5.76 MIL/uL (ref 4.22–5.81)
RDW: 11.9 % (ref 11.5–15.5)
WBC: 11.5 10*3/uL — ABNORMAL HIGH (ref 4.0–10.5)
nRBC: 0 % (ref 0.0–0.2)

## 2022-01-23 LAB — CBC
HCT: 50.1 % (ref 39.0–52.0)
Hemoglobin: 17.3 g/dL — ABNORMAL HIGH (ref 13.0–17.0)
MCH: 30 pg (ref 26.0–34.0)
MCHC: 34.5 g/dL (ref 30.0–36.0)
MCV: 86.8 fL (ref 80.0–100.0)
Platelets: 251 10*3/uL (ref 150–400)
RBC: 5.77 MIL/uL (ref 4.22–5.81)
RDW: 12 % (ref 11.5–15.5)
WBC: 11.4 10*3/uL — ABNORMAL HIGH (ref 4.0–10.5)
nRBC: 0 % (ref 0.0–0.2)

## 2022-01-23 LAB — BASIC METABOLIC PANEL
Anion gap: 11 (ref 5–15)
BUN: 17 mg/dL (ref 8–23)
CO2: 27 mmol/L (ref 22–32)
Calcium: 9.3 mg/dL (ref 8.9–10.3)
Chloride: 98 mmol/L (ref 98–111)
Creatinine, Ser: 0.95 mg/dL (ref 0.61–1.24)
GFR, Estimated: >60 mL/min (ref >60)
Glucose, Bld: 150 mg/dL — ABNORMAL HIGH (ref 70–99)
Potassium: 3.9 mmol/L (ref 3.5–5.1)
Sodium: 136 mmol/L (ref 135–145)

## 2022-01-23 LAB — RESP PANEL BY RT-PCR (FLU A&B, COVID) ARPGX2
Influenza A by PCR: NEGATIVE
Influenza B by PCR: NEGATIVE
SARS Coronavirus 2 by RT PCR: NEGATIVE

## 2022-01-23 MED ORDER — SODIUM CHLORIDE 0.9 % IV BOLUS
500.0000 mL | Freq: Once | INTRAVENOUS | Status: AC
  Administered 2022-01-23: 500 mL via INTRAVENOUS

## 2022-01-23 MED ORDER — METOCLOPRAMIDE HCL 5 MG/ML IJ SOLN
10.0000 mg | Freq: Once | INTRAMUSCULAR | Status: AC
Start: 2022-01-23 — End: 2022-01-23
  Administered 2022-01-23: 10 mg via INTRAVENOUS
  Filled 2022-01-23: qty 2

## 2022-01-23 MED ORDER — DIPHENHYDRAMINE HCL 50 MG/ML IJ SOLN
25.0000 mg | Freq: Once | INTRAMUSCULAR | Status: AC
  Administered 2022-01-23: 25 mg via INTRAVENOUS
  Filled 2022-01-23: qty 1

## 2022-01-23 NOTE — ED Notes (Signed)
Patient transported to X-ray and returned, placed back on cardiac monitor

## 2022-01-23 NOTE — Telephone Encounter (Signed)
Patient called into the cath lab along with wife. Reports he has had significant headache since cath yesterday. HR elevated at 111bpm, along with BP 120/119. He's also had chest pain and shortness of breath. Sounds quiet symptomatic over the phone. Discussed with patient/wife that he may been in afib as he has hx of the same. Given how symptomatic he is I advised he be seen in the closest ED for evaluation. Cameron was held with need for cardiac cath yesterday. They are agreeable to be seen in the ED. Thanked me for follow up call.   Cath: 01/22/22  Mid LAD lesion is 60% stenosed.  RFR negative at 0.93   Prox RCA lesion is 60% stenosed.  Relatively small caliber codominant vessel-not good PCI target.   The left ventricular systolic function is normal. The left ventricular ejection fraction is 55-65% by visual estimate.   LV end diastolic pressure is normal.   There is no aortic valve stenosis.   SUMMARY Moderate two-vessel disease with a relatively small caliber codominant RCA 60% stenosis and mid LAD (actually diminutive LAD) 60% stenosis that is RFR negative (0.93).   Otherwise the major first diagonal branch and codominant LCx with large branching marginal branch and AV groove branch have minimal if any disease Preserved LVEF with normal LVEDP     RECOMMENDATIONS Consider nighttime dose of long-acting beta-blocker plus or minus calcium channel blocker for morning time chest pain that could be related to spasm. Agree with aspirin provided no significant bleeding while on DOAC. Aggressive risk factor modification.  Glenetta Hew, MD  Diagnostic Dominance: Co-dominant

## 2022-01-23 NOTE — ED Triage Notes (Signed)
Had a heart cath at [facility] yesterday.  Told he had diffuse CAD but nothing narrow enough that required stenting at this time.  Headache onset yesterday afternoon that has progressed to a 10/10  Has history of A. Fib and can feel when his heart flips from NSR to Afib.   He states he was going in and out of Afib yesterday but went into Afib around 10am Today with rapid heart rate and elevated BP at home.  They called the cardiology office and they recommended he come to ER for eval.   LHC access site WNL Good radial pulse  Pt. Has advanced Parkinson's with significant upper extremity tremors.

## 2022-01-23 NOTE — ED Notes (Signed)
Patient transported to CT 

## 2022-01-23 NOTE — Discharge Instructions (Addendum)
For your headache at home, you can take Reglan 10 mg with Benadryl 25 mg once every 8 hours as needed.  This is most effective at night.  You can also use Tylenol in the daytime regularly.  Continue drinking plenty of water.  It is possible your headache is related to a tension type headache, migraine, or a virus, and therefore should be improving over the next 1 to 2 days.  If your headache is getting worse, or if you begin having fever (temperature over 100.62F), stiffness or difficulty moving your neck, extreme sensitivity to light, or confusion, or any stroke symptoms, please come back to the ER immediately.  These may be signs of more serious condition including a brain infection that needs prompt treatment.  Please call your doctor's office to try to arrange for rapid follow-up appointment on Monday morning if possible.

## 2022-01-23 NOTE — ED Provider Notes (Signed)
[facility] EMERGENCY DEPARTMENT Provider Note   CSN: [id number] Arrival date & time: 01/23/22  1357     History  CC: Headache   Charles Huynh is a 61 y.o. male presenting to ED with headache, fatigue.  Onset gradually yesterday.  Had [place] yesterday with Dr Ellyn Hack (60% stenosis, no critical stenosis, no stents placed).  Reports headache began then and worse today.  Feels weak all over.  Nausea, no vomiting.  No fevers.  Chronic smokers cough.  No diarrhea.  Hx of parkinson's, has resting tremor at baseline  Here with wife  HPI     Home Medications Prior to Admission medications   Medication Sig Start Date End Date Taking? Authorizing Provider  albuterol (VENTOLIN HFA) 108 (90 Base) MCG/ACT inhaler Inhale 2 puffs into the lungs every 4 (four) hours as needed for wheezing or shortness of breath. 12/19/19   [provider]  baclofen (LIORESAL) 10 MG tablet Take 10 mg by mouth 3 (three) times daily as needed for muscle spasms. 07/07/19   [provider]  bismuth subsalicylate (PEPTO BISMOL) 262 MG/15ML suspension Take 30 mLs by mouth every 6 (six) hours as needed for indigestion.    [provider]  carbidopa-levodopa (SINEMET IR) 25-250 MG tablet Take 2 tablets by mouth 2 (two) times daily.    [provider]  citalopram (CELEXA) 20 MG tablet Take 20 mg by mouth daily.    [provider]  D3-50 1.25 MG (50000 UT) capsule Take 50,000 Units by mouth every Sunday. 01/06/22   [provider]  DILT-XR 120 MG 24 hr capsule Take 120 mg by mouth daily. 08/25/19   [provider]  diphenhydrAMINE (BENADRYL) 25 MG tablet Take 25 mg by mouth daily as needed for allergies.    [provider]  ELIQUIS 5 MG TABS tablet Take 1 tablet (5 mg total) by mouth 2 (two) times daily. 10/26/20   Tobb, Kardie, DO  esomeprazole (NEXIUM) 40 MG capsule Take 40 mg by mouth daily.    [provider]  fentaNYL (DURAGESIC) 25  MCG/HR Place 1 patch onto the skin as directed. Apply every 2.5 days 01/10/21   [provider]  hydrochlorothiazide (MICROZIDE) 12.5 MG capsule Take 1 capsule (12.5 mg total) by mouth daily. Patient taking differently: Take 12.5 mg by mouth every Monday, Wednesday, and Friday. 10/26/20   Tobb, Kardie, DO  hydrocortisone cream 1 % Apply 1 application topically 2 (two) times daily as needed for itching.    [provider]  isosorbide mononitrate (IMDUR) 60 MG 24 hr tablet Take 1 tablet (60 mg total) by mouth daily. Needs appointment for future refills / 1st attempt 12/13/21   Richardo Priest, MD  JANUVIA 100 MG tablet Take 100 mg by mouth daily. 08/08/19   [provider]  JARDIANCE 25 MG TABS tablet Take 25 mg by mouth daily. 03/20/20   [provider]  levothyroxine (SYNTHROID) 75 MCG tablet Take 75 mcg by mouth daily. 01/09/22   [provider]  Menthol, Topical Analgesic, (BIOFREEZE EX) Apply 1 application topically daily as needed (shoulder pain).    [provider]  metFORMIN (GLUCOPHAGE-XR) 500 MG 24 hr tablet Take 1,000 mg by mouth daily. 07/17/20   [provider]  metoCLOPramide (REGLAN) 10 MG tablet Take 10 mg by mouth 2 (two) times daily. 08/18/19   [provider]  metoprolol tartrate (LOPRESSOR) 100 MG tablet Take 1 tablet (100 mg total) by mouth 2 (two) times  daily. 10/26/20   Tobb, Kardie, DO  Naldemedine Tosylate (SYMPROIC) 0.2 MG TABS Take 0.2 mg by mouth daily as needed (constipation).    [provider]  Naphazoline HCl (CLEAR EYES OP) Place 1 drop into both eyes daily as needed (itchy eyes).    [provider]  nitroGLYCERIN (NITROSTAT) 0.4 MG SL tablet PLACE 1 TABLET UNDER THE TONGUE EVERY 5 MINUTES AS NEEDED FOR CHEST PAIN 01/14/22   Tobb, Kardie, DO  NUPLAZID 34 MG CAPS Take 1 capsule by mouth daily. 09/12/20   [provider]  OVER THE COUNTER MEDICATION Place 1 spray in ear(s) daily as  needed (itchy ears).    [provider]  oxyCODONE (ROXICODONE) 15 MG immediate release tablet Take 15 mg by mouth 5 (five) times daily as needed for pain. 03/27/21   [provider]  potassium chloride (KLOR-CON) 10 MEQ tablet Take 1 tablet by mouth daily 10/26/20   Tobb, Kardie, DO  pregabalin (LYRICA) 300 MG capsule Take 300 mg by mouth 2 (two) times daily. 07/28/19   [provider]  promethazine (PHENERGAN) 25 MG tablet Take 25 mg by mouth every 6 (six) hours as needed for nausea or vomiting.    [provider]  ranolazine (RANEXA) 500 MG 12 hr tablet Take 1 tablet (500 mg total) by mouth 2 (two) times daily. 01/14/22   Tobb, Kardie, DO  rosuvastatin (CRESTOR) 20 MG tablet Take 1 tablet (20 mg total) by mouth daily. 01/14/22 04/14/22  Tobb, Kardie, DO  testosterone cypionate (DEPOTESTOSTERONE CYPIONATE) 200 MG/ML injection Inject 200 mg into the muscle every 14 (fourteen) days. 03/13/20   [provider]  TRESIBA FLEXTOUCH 100 UNIT/ML FlexTouch Pen Inject 22 Units into the skin at bedtime. 03/22/21   [provider]      Allergies    Naproxen sodium and Betadine [povidone iodine]    Review of Systems   Review of Systems  Physical Exam Updated Vital Signs BP 129/81    Pulse 92    Temp 98.7 F (37.1 C) (Oral)    Resp 11    Ht 5\' 9"  (1.753 m)    Wt 74.4 kg    SpO2 99%    BMI 24.22 kg/m  Physical Exam Constitutional:      General: He is not in acute distress. HENT:     Head: Normocephalic and atraumatic.  Eyes:     Extraocular Movements: Extraocular movements intact.     Conjunctiva/sclera: Conjunctivae normal.     Pupils: Pupils are equal, round, and reactive to light.     Comments: No photophobia  Cardiovascular:     Rate and Rhythm: Normal rate. Rhythm irregular.  Pulmonary:     Effort: Pulmonary effort is normal. No respiratory distress.     Breath sounds: Normal breath sounds.  Musculoskeletal:     Cervical back: Normal range of  motion and neck supple. No rigidity.  Lymphadenopathy:     Cervical: No cervical adenopathy.  Skin:    General: Skin is warm and dry.  Neurological:     General: No focal deficit present.     Mental Status: He is alert and oriented to person, place, and time. Mental status is at baseline.  Psychiatric:        Mood and Affect: Mood normal.        Behavior: Behavior normal.    ED Results / Procedures / Treatments   Labs (all labs ordered are listed, but only abnormal results are displayed) Labs  Reviewed  BASIC METABOLIC PANEL - Abnormal; Notable for the following components:      Result Value   Glucose, Bld 150 (*)    All other components within normal limits  CBC - Abnormal; Notable for the following components:   WBC 11.4 (*)    Hemoglobin 17.3 (*)    All other components within normal limits  CBC WITH DIFFERENTIAL/PLATELET - Abnormal; Notable for the following components:   WBC 11.5 (*)    Hemoglobin 17.4 (*)    All other components within normal limits  RESP PANEL BY RT-PCR (FLU A&B, COVID) [id number]    EKG EKG Interpretation  Date/Time:  Thursday January 23 2022 14:03:54 EST Ventricular Rate:  106 PR Interval:  147 QRS Duration: 137 QT Interval:  371 QTC Calculation: 493 R Axis:   58 Text Interpretation: Sinus tachycardia Right atrial enlargement Left bundle branch block No significant change since last tracing Confirmed by Fredia Sorrow [phone]) on 01/23/2022 2:07:39 PM  Radiology DG Chest 2 View  Result Date: 01/23/2022 CLINICAL DATA:  Atrial fibrillation. Cardiac catheterization yesterday. EXAM: CHEST - 2 VIEW COMPARISON:  AP chest 02/25/2021 FINDINGS: Cardiac silhouette and mediastinal contours are within normal limits. ACDF hardware overlies the lower cervical spine. Lungs are clear. No pleural effusion or pneumothorax. Mild multilevel degenerative disc changes of the thoracic spine. Spinal cord stimulator electrodes again overlie the lower cervical and upper  thoracic spine. A generator pack again overlies the left upper abdominal flank. IMPRESSION: No active cardiopulmonary disease. Electronically Signed   By: Yvonne Kendall M.D.   On: 01/23/2022 15:04   CT Head Wo Contrast  Result Date: 01/23/2022 CLINICAL DATA:  Headache. EXAM: CT HEAD WITHOUT CONTRAST TECHNIQUE: Contiguous axial images were obtained from the base of the skull through the vertex without intravenous contrast. RADIATION DOSE REDUCTION: This exam was performed according to the departmental dose-optimization program which includes automated exposure control, adjustment of the mA and/or kV according to patient size and/or use of iterative reconstruction technique. COMPARISON:  February 25, 2021. FINDINGS: Brain: No evidence of acute infarction, hemorrhage, hydrocephalus, extra-axial collection or mass lesion/mass effect. Vascular: No hyperdense vessel or unexpected calcification. Skull: Normal. Negative for fracture or focal lesion. Sinuses/Orbits: No acute finding. Other: None. IMPRESSION: No acute intracranial abnormality seen. Electronically Signed   By: Marijo Conception M.D.   On: 01/23/2022 15:49   CARDIAC CATHETERIZATION  Result Date: 01/22/2022   Mid LAD lesion is 60% stenosed.  RFR negative at 0.93   Prox RCA lesion is 60% stenosed.  Relatively small caliber codominant vessel-not good PCI target.   The left ventricular systolic function is normal. The left ventricular ejection fraction is 55-65% by visual estimate.   LV end diastolic pressure is normal.   There is no aortic valve stenosis. SUMMARY Moderate two-vessel disease with a relatively small caliber codominant RCA 60% stenosis and mid LAD (actually diminutive LAD) 60% stenosis that is RFR negative (0.93).  Otherwise the major first diagonal branch and codominant LCx with large branching marginal branch and AV groove branch have minimal if any disease Preserved LVEF with normal LVEDP RECOMMENDATIONS Consider nighttime dose of long-acting  beta-blocker plus or minus calcium channel blocker for morning time chest pain that could be related to spasm. Agree with aspirin provided no significant bleeding while on DOAC. Aggressive risk factor modification. Glenetta Hew, MD   Procedures Procedures    Medications Ordered in ED Medications  sodium chloride 0.9 % bolus 500 mL (0 mLs Intravenous Stopped 01/23/22  1649)  diphenhydrAMINE (BENADRYL) injection 25 mg (25 mg Intravenous Given 01/23/22 1532)  metoCLOPramide (REGLAN) injection 10 mg (10 mg Intravenous Given 01/23/22 1532)    ED Course/ Medical Decision Making/ A&P Clinical Course as of 01/23/22 1815  Thu Jan 23, 2022  1742 Patient reports significant improvement of headache with IV meds. Wants to go home.  I think this is reasonable -I think the syndrome could be consistent with a viral illness.  We did discuss the small or possibility of meningitis, although this time he does not demonstrate photophobia or nuchal rigidity or fever to suggest meningitis.  Return precautions were discussed for those particular symptoms.  He and his wife at bedside verbalized understanding.  Do not believe there is indication for antibiotics at this time.  Advise close PCP follow-up [MT]    Clinical Course User Index [MT] Ladan Vanderzanden, Carola Rhine, MD                           Medical Decision Making Amount and/or Complexity of Data Reviewed Labs: ordered. Radiology: ordered.  Risk Prescription drug management.   This patient presents to the Emergency Department with complaint of headache.  This involves an extensive number of treatment options, and is a complaint that carries with it a high risk of complications and morbidity.  The differential diagnosis for headache includes tension type headache vs occipital headache vs migraine vs sinusitis vs intracranial bleed vs other  I ordered, reviewed, and interpreted labs, including BMP and CBC.  There were no immediate, life-threatening emergencies found in  this labwork.  Very mild leukocytosis  I ordered medication for headache and/or nausea I ordered imaging studies which included CTH I independently visualized and interpreted imaging which showed no acute CVA or intracranial mass and the monitor tracing which showed A  Fib with regular heart rate Additional history was obtained from patient's wife at bedside I personally reviewed the patients ECG which showed A Fib with normal HR.  After the interventions stated above, I reevaluated the patient and found that the patient remained clinically stable, headache improved.  Based on the patient's clinical exam, vital signs, risk factors, and ED testing, I felt that the patient's overall risk of life-threatening emergency such as ICH, meningitis, intracranial mass or tumor was quite low.  I suspect this clinical presentation is most consistent with migraine or viral headache, but explained to the patient that this evaluation was not a definitive diagnostic workup.  I discussed outpatient follow up with primary care provider, and provided specialist office number on the patient's discharge paper if a referral was deemed necessary.  I discussed return precautions with the patient. I felt the patient was clinically stable for discharge.         Final Clinical Impression(s) / ED Diagnoses Final diagnoses:  Nonintractable headache, unspecified chronicity pattern, unspecified headache type    Rx / DC Orders ED Discharge Orders     None         Wyvonnia Dusky, MD 01/23/22 1815

## 2022-02-12 ENCOUNTER — Ambulatory Visit: Payer: Medicare Other | Admitting: Cardiology

## 2022-02-17 ENCOUNTER — Other Ambulatory Visit: Payer: Self-pay | Admitting: Cardiology

## 2022-02-27 ENCOUNTER — Ambulatory Visit: Payer: Medicare Other | Admitting: Cardiology

## 2022-02-27 ENCOUNTER — Encounter: Payer: Self-pay | Admitting: Cardiology

## 2022-02-27 VITALS — BP 140/68 | HR 72 | Ht 69.0 in | Wt 179.0 lb

## 2022-02-27 DIAGNOSIS — I25118 Atherosclerotic heart disease of native coronary artery with other forms of angina pectoris: Secondary | ICD-10-CM

## 2022-02-27 DIAGNOSIS — I10 Essential (primary) hypertension: Secondary | ICD-10-CM | POA: Diagnosis not present

## 2022-02-27 DIAGNOSIS — F17200 Nicotine dependence, unspecified, uncomplicated: Secondary | ICD-10-CM

## 2022-02-27 DIAGNOSIS — I480 Paroxysmal atrial fibrillation: Secondary | ICD-10-CM | POA: Diagnosis not present

## 2022-02-27 DIAGNOSIS — E119 Type 2 diabetes mellitus without complications: Secondary | ICD-10-CM

## 2022-02-27 MED ORDER — ISOSORBIDE MONONITRATE ER 60 MG PO TB24: ORAL_TABLET | ORAL | 3 refills | Status: DC

## 2022-02-27 MED ORDER — RANOLAZINE ER 1000 MG PO TB12
1000.0000 mg | ORAL_TABLET | Freq: Two times a day (BID) | ORAL | 3 refills | Status: DC
Start: 2022-02-27 — End: 2024-06-07

## 2022-02-27 NOTE — Progress Notes (Signed)
?Cardiology Office Note:   ? ?Date:  02/27/2022  ? ?ID:  Charles Huynh, DOB 05-19-1961, MRN [id number] ? ?PCP:  Verdis Prime, PA-C  ?Cardiologist:  Thomasene Ripple, DO  ?Electrophysiologist:  None  ? ?Referring MD: Verdis Prime, PA-C  ? ?" I am doing fine" ? ?History of Present Illness:   ? ?Charles Huynh is a 61 y.o. male with a hx of moderate coronary artery disease seen on coronary CT scan, paroxysmal atrial fibrillation on Eliquis, TIA, CVA, here today for follow-up visit. ? ?At his last visit on January 31st, 2023 he was experiencing significant chest pain.  I added Ranexa 500 mg twice daily and set the patient up for heart catheterization.  He had his heart catheterization no PCI indicated. ? ?He is here today for follow-up visit.  He tells me that he still is experiencing chest discomfort.  Also he notes that his Imdur has not been felt he needs new medicine.  No worsening shortness of breath. ? ?He is here today with his wife.  She is she is ? ? ?Past Medical History:  ?Diagnosis Date  ? Anxiety   ? Atrial fibrillation (HCC)   ? Barrett esophagus   ? Benign prostatic hyperplasia with urinary obstruction 12/28/2015  ? Cerebrovascular accident (HCC) 07/09/2017  ? Chronic pain of both shoulders 04/08/2021  ? Chronic pain syndrome 07/09/2017  ? Clotting disorder (HCC)   ? Coronary artery disease of native artery of native heart with stable angina pectoris (HCC) 01/03/2020  ? Decreased testosterone level 07/09/2017  ? Depression   ? Diabetes (HCC)   ? Dyslipidemia 03/11/2021  ? Dysphagia 03/11/2021  ? Dyspnea 03/11/2021  ? Essential hypertension 08/30/2019  ? Fatigue 03/11/2021  ? Gastroesophageal reflux disease without esophagitis 09/02/2017  ? Gastroparesis 01/24/2021  ? Heart disease   ? History of ulcer disease   ? Hx pulmonary embolism   ? Hyperlipidemia   ? Hypertension   ? Hypogonadism in male 03/11/2021  ? 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 02/06/2021  ? Hypothyroidism 07/09/2017  ? Intermittent palpitations 03/11/2021  ? Mild  episode of recurrent major depressive disorder (HCC) 01/24/2021  ? Mixed hyperlipidemia 01/03/2020  ? Nausea 03/11/2021  ? Nicotine dependence 03/11/2021  ? PAF (paroxysmal atrial fibrillation) (HCC) 01/03/2020  ? Parkinson's disease (HCC)   ? Shoulder pain 03/11/2021  ? Smoker 03/11/2021  ? Spasm of back muscles 03/11/2021  ? Stroke [facility])   ? Thyroid disease   ? Type 2 diabetes mellitus without complication (HCC) 07/09/2017  ? Wound of skin 03/11/2021  ? ? ?Past Surgical History:  ?Procedure Laterality Date  ? BACK SURGERY    ? CARDIAC CATHETERIZATION    ? COLONOSCOPY  2019  ? have a small polyp [place] Philis Kendall  ? ESOPHAGOGASTRODUODENOSCOPY  2019  ? Dr Philis Kendall. [place]  ? INTRAVASCULAR PRESSURE WIRE/FFR STUDY N/A 01/22/2022  ? Procedure: INTRAVASCULAR PRESSURE WIRE/FFR STUDY;  Surgeon: Marykay Lex, MD;  Location[facility]VAS[facility] CV LAB;  Service: Cardiovascular;  Laterality: N/A;  ? KNEE ARTHROSCOPY Left   ? LEFT HEART CATH AND CORONARY ANGIOGRAPHY N/A 01/22/2022  ? Procedure: LEFT HEART CATH AND CORONARY ANGIOGRAPHY;  Surgeon: Marykay Lex, MD;  Location: [facility] INVASIVE CV LAB;  Service: Cardiovascular;  Laterality: N/A;  ? NECK SURGERY    ? SPINAL CORD STIMULATOR INSERTION    ? WRIST SURGERY Bilateral   ? ? ?Current Medications: ?Current Meds  ?Medication Sig  ? albuterol (VENTOLIN HFA) 108 (90 Base)  MCG/ACT inhaler Inhale 2 puffs into the lungs every 4 (four) hours as needed for wheezing or shortness of breath.  ? baclofen (LIORESAL) 10 MG tablet Take 10 mg by mouth 3 (three) times daily as needed for muscle spasms.  ? bismuth subsalicylate (PEPTO BISMOL) 262 MG/15ML suspension Take 30 mLs by mouth every 6 (six) hours as needed for indigestion.  ? carbidopa-levodopa (SINEMET IR) 25-250 MG tablet Take 2 tablets by mouth 2 (two) times daily.  ? citalopram (CELEXA) 20 MG tablet Take 20 mg by mouth daily.  ? D3-50 1.25 MG (50000 UT) capsule Take 50,000 Units by mouth every Sunday.  ? DILT-XR 120 MG 24 hr  capsule Take 120 mg by mouth daily.  ? diphenhydrAMINE (BENADRYL) 25 MG tablet Take 25 mg by mouth daily as needed for allergies.  ? ELIQUIS 5 MG TABS tablet Take 1 tablet (5 mg total) by mouth 2 (two) times daily.  ? esomeprazole (NEXIUM) 40 MG capsule Take 40 mg by mouth daily.  ? fentaNYL (DURAGESIC) 25 MCG/HR Place 1 patch onto the skin as directed. Apply every 2.5 days  ? hydrochlorothiazide (MICROZIDE) 12.5 MG capsule Take 1 capsule (12.5 mg total) by mouth daily. (Patient taking differently: Take 12.5 mg by mouth every Monday, Wednesday, and Friday.)  ? hydrocortisone cream 1 % Apply 1 application topically 2 (two) times daily as needed for itching.  ? JANUVIA 100 MG tablet Take 100 mg by mouth daily.  ? JARDIANCE 25 MG TABS tablet Take 25 mg by mouth daily.  ? levothyroxine (SYNTHROID) 75 MCG tablet Take 75 mcg by mouth daily.  ? Menthol, Topical Analgesic, (BIOFREEZE EX) Apply 1 application topically daily as needed (shoulder pain).  ? metFORMIN (GLUCOPHAGE-XR) 500 MG 24 hr tablet Take 1,000 mg by mouth daily.  ? metoCLOPramide (REGLAN) 10 MG tablet Take 10 mg by mouth 2 (two) times daily.  ? metoprolol tartrate (LOPRESSOR) 100 MG tablet Take 1 tablet (100 mg total) by mouth 2 (two) times daily.  ? Naldemedine Tosylate (SYMPROIC) 0.2 MG TABS Take 0.2 mg by mouth daily as needed (constipation).  ? Naphazoline HCl (CLEAR EYES OP) Place 1 drop into both eyes daily as needed (itchy eyes).  ? nitroGLYCERIN (NITROSTAT) 0.4 MG SL tablet PLACE 1 TABLET UNDER THE TONGUE EVERY 5 MINUTES AS NEEDED FOR CHEST PAIN  ? NUPLAZID 34 MG CAPS Take 1 capsule by mouth daily.  ? OVER THE COUNTER MEDICATION Place 1 spray in ear(s) daily as needed (itchy ears).  ? oxyCODONE (ROXICODONE) 15 MG immediate release tablet Take 15 mg by mouth 5 (five) times daily as needed for pain.  ? potassium chloride (KLOR-CON) 10 MEQ tablet Take 1 tablet by mouth daily  ? pregabalin (LYRICA) 300 MG capsule Take 300 mg by mouth 2 (two) times daily.   ? promethazine (PHENERGAN) 25 MG tablet Take 25 mg by mouth every 6 (six) hours as needed for nausea or vomiting.  ? ranolazine (RANEXA) 1000 MG SR tablet Take 1 tablet (1,000 mg total) by mouth 2 (two) times daily.  ? rosuvastatin (CRESTOR) 20 MG tablet Take 1 tablet (20 mg total) by mouth daily.  ? testosterone cypionate (DEPOTESTOSTERONE CYPIONATE) 200 MG/ML injection Inject 200 mg into the muscle every 14 (fourteen) days.  ? TRESIBA FLEXTOUCH 100 UNIT/ML FlexTouch Pen Inject 22 Units into the skin at bedtime.  ? [DISCONTINUED] isosorbide mononitrate (IMDUR) 60 MG 24 hr tablet TAKE 1 TABLET(60 MG) BY MOUTH DAILY  ? [DISCONTINUED] ranolazine (RANEXA) 500 MG 12 hr  tablet Take 1 tablet (500 mg total) by mouth 2 (two) times daily.  ?  ? ?Allergies:   Naproxen sodium and Betadine [povidone iodine]  ? ?Social History  ? ?Socioeconomic History  ? Marital status: Married  ?  Spouse name: Not on file  ? Number of children: Not on file  ? Years of education: Not on file  ? Highest education level: Not on file  ?Occupational History  ? Not on file  ?Tobacco Use  ? Smoking status: Every Day  ?  Packs/day: 1.00  ?  Years: 47.00  ?  Pack years: 47.00  ?  Types: Cigarettes  ? Smokeless tobacco: Never  ?Vaping Use  ? Vaping Use: Never used  ?Substance and Sexual Activity  ? Alcohol use: Not Currently  ?  Comment: very seldom; 1 -2 times per year  ? Drug use: Never  ? Sexual activity: Not on file  ?Other Topics Concern  ? Not on file  ?Social History Narrative  ? Not on file  ? ?Social Determinants of Health  ? ?Financial Resource Strain: Not on file  ?Food Insecurity: Not on file  ?Transportation Needs: Not on file  ?Physical Activity: Not on file  ?Stress: Not on file  ?Social Connections: Not on file  ?  ? ?Family History: ?The patient's family history includes Emphysema in his father; Heart attack in his father; Heart disease in his father; Lung cancer in his mother. There is no history of Colon cancer, Esophageal cancer,  Stomach cancer, or Rectal cancer. ? ?ROS:   ?Review of Systems  ?Constitution: Negative for decreased appetite, fever and weight gain.  ?HENT: Negative for congestion, ear discharge, hoarse voice and sore throat.

## 2022-02-27 NOTE — Patient Instructions (Addendum)
Medication Instructions:  ?Your physician has recommended you make the following change in your medication:  ?INCREASE: Ranexa 1000 mg twice daily ?*If you need a refill on your cardiac medications before your next appointment, please call your pharmacy* ? ? ?Lab Work: ?None ?If you have labs (blood work) drawn today and your tests are completely normal, you will receive your results only by: ?MyChart Message (if you have MyChart) OR ?A paper copy in the mail ?If you have any lab test that is abnormal or we need to change your treatment, we will call you to review the results. ? ? ?Testing/Procedures: ?None ? ? ?Follow-Up: ?At [facility], you and your health needs are our priority.  As part of our continuing mission to provide you with exceptional heart care, we have created designated Provider Care Teams.  These Care Teams include your primary Cardiologist (physician) and Advanced Practice Providers (APPs -  Physician Assistants and Nurse Practitioners) who all work together to provide you with the care you need, when you need it. ? ?We recommend signing up for the patient portal called "MyChart".  Sign up information is provided on this After Visit Summary.  MyChart is used to connect with patients for Virtual Visits (Telemedicine).  Patients are able to view lab/test results, encounter notes, upcoming appointments, etc.  Non-urgent messages can be sent to your provider as well.   ?To learn more about what you can do with MyChart, go to [email or web address].   ? ?Your next appointment:   ?12 week(s) ? ?The format for your next appointment:   ?Virtual Visit  ? ?Provider:   ?Thomasene Ripple, DO   ? ? ?Other Instructions ?  ?

## 2022-03-12 ENCOUNTER — Emergency Department (HOSPITAL_BASED_OUTPATIENT_CLINIC_OR_DEPARTMENT_OTHER)
Admission: EM | Admit: 2022-03-12 | Discharge: 2022-03-12 | Disposition: A | Discharge status: Home / Self Care | Payer: Medicare Other | Attending: Emergency Medicine | Admitting: Emergency Medicine

## 2022-03-12 ENCOUNTER — Encounter (HOSPITAL_BASED_OUTPATIENT_CLINIC_OR_DEPARTMENT_OTHER): Payer: Self-pay

## 2022-03-12 ENCOUNTER — Other Ambulatory Visit: Payer: Self-pay

## 2022-03-12 ENCOUNTER — Emergency Department (HOSPITAL_BASED_OUTPATIENT_CLINIC_OR_DEPARTMENT_OTHER): Payer: Medicare Other

## 2022-03-12 DIAGNOSIS — Z7984 Long term (current) use of oral hypoglycemic drugs: Secondary | ICD-10-CM | POA: Diagnosis not present

## 2022-03-12 DIAGNOSIS — M4322 Fusion of spine, cervical region: Secondary | ICD-10-CM | POA: Insufficient documentation

## 2022-03-12 DIAGNOSIS — Z79899 Other long term (current) drug therapy: Secondary | ICD-10-CM | POA: Diagnosis not present

## 2022-03-12 DIAGNOSIS — I4891 Unspecified atrial fibrillation: Secondary | ICD-10-CM | POA: Diagnosis not present

## 2022-03-12 DIAGNOSIS — G8929 Other chronic pain: Secondary | ICD-10-CM | POA: Insufficient documentation

## 2022-03-12 DIAGNOSIS — Z7901 Long term (current) use of anticoagulants: Secondary | ICD-10-CM | POA: Diagnosis not present

## 2022-03-12 DIAGNOSIS — I10 Essential (primary) hypertension: Secondary | ICD-10-CM | POA: Diagnosis not present

## 2022-03-12 DIAGNOSIS — I2510 Atherosclerotic heart disease of native coronary artery without angina pectoris: Secondary | ICD-10-CM | POA: Insufficient documentation

## 2022-03-12 DIAGNOSIS — M5416 Radiculopathy, lumbar region: Secondary | ICD-10-CM | POA: Diagnosis not present

## 2022-03-12 DIAGNOSIS — G20 Parkinson's disease: Secondary | ICD-10-CM | POA: Diagnosis not present

## 2022-03-12 DIAGNOSIS — M545 Low back pain, unspecified: Secondary | ICD-10-CM | POA: Diagnosis present

## 2022-03-12 DIAGNOSIS — Z981 Arthrodesis status: Secondary | ICD-10-CM

## 2022-03-12 NOTE — ED Provider Notes (Signed)
?[facility] EMERGENCY DEPARTMENT ?Provider Note ? ? ?CSN: [id number] ?Arrival date & time: 03/12/22  1307 ? ?  ? ?History ? ?Chief Complaint  ?Patient presents with  ? Fall  ? ? ?Charles Huynh is a 61 y.o. male hx of CAD, A-fib on Eliquis, hypertension, Parkinson's here presenting with a fall.  Patient had a mechanical fall about a week ago.  Patient landed on his lower back.  Patient also felt a pop in his neck but did not hit his neck.  Patient is on chronic pain medicine (oxycodone) and also on baclofen.  He states that he has progressive lower back pain that radiate down the right leg.  He also has worsening neck pain.  Denies any numbness or weakness.  Denies any other injuries.  In particular he denies any head injury ? ?The history is provided by the patient.  ? ?  ? ?Home Medications ?Prior to Admission medications   ?Medication Sig Start Date End Date Taking? Authorizing Provider  ?albuterol (VENTOLIN HFA) 108 (90 Base) MCG/ACT inhaler Inhale 2 puffs into the lungs every 4 (four) hours as needed for wheezing or shortness of breath. 12/19/19   [provider]  ?baclofen (LIORESAL) 10 MG tablet Take 10 mg by mouth 3 (three) times daily as needed for muscle spasms. 07/07/19   [provider]  ?bismuth subsalicylate (PEPTO BISMOL) 262 MG/15ML suspension Take 30 mLs by mouth every 6 (six) hours as needed for indigestion.    [provider]  ?carbidopa-levodopa (SINEMET IR) 25-250 MG tablet Take 2 tablets by mouth 2 (two) times daily.    [provider]  ?citalopram (CELEXA) 20 MG tablet Take 20 mg by mouth daily.    [provider]  ?D3-50 1.25 MG (50000 UT) capsule Take 50,000 Units by mouth every Sunday. 01/06/22   [provider]  ?DILT-XR 120 MG 24 hr capsule Take 120 mg by mouth daily. 08/25/19   [provider]  ?diphenhydrAMINE (BENADRYL) 25 MG tablet Take 25 mg by mouth daily as needed for allergies.    [provider]  ?ELIQUIS  5 MG TABS tablet Take 1 tablet (5 mg total) by mouth 2 (two) times daily. 10/26/20   Tobb, Kardie, DO  ?esomeprazole (NEXIUM) 40 MG capsule Take 40 mg by mouth daily.    [provider]  ?fentaNYL (DURAGESIC) 25 MCG/HR Place 1 patch onto the skin as directed. Apply every 2.5 days 01/10/21   [provider]  ?hydrochlorothiazide (MICROZIDE) 12.5 MG capsule Take 1 capsule (12.5 mg total) by mouth daily. ?Patient taking differently: Take 12.5 mg by mouth every Monday, Wednesday, and Friday. 10/26/20   Tobb, Kardie, DO  ?hydrocortisone cream 1 % Apply 1 application topically 2 (two) times daily as needed for itching.    [provider]  ?isosorbide mononitrate (IMDUR) 60 MG 24 hr tablet TAKE 1 TABLET(60 MG) BY MOUTH DAILY 02/27/22   Tobb, Kardie, DO  ?JANUVIA 100 MG tablet Take 100 mg by mouth daily. 08/08/19   [provider]  ?JARDIANCE 25 MG TABS tablet Take 25 mg by mouth daily. 03/20/20   [provider]  ?levothyroxine (SYNTHROID) 75 MCG tablet Take 75 mcg by mouth daily. 01/09/22   [provider]  ?Menthol, Topical Analgesic, (BIOFREEZE EX) Apply 1 application topically daily as needed (shoulder pain).    [provider]  ?metFORMIN (GLUCOPHAGE-XR) 500 MG 24 hr tablet Take 1,000 mg by mouth daily. 07/17/20   [provider]  ?  metoCLOPramide (REGLAN) 10 MG tablet Take 10 mg by mouth 2 (two) times daily. 08/18/19   [provider]  ?metoprolol tartrate (LOPRESSOR) 100 MG tablet Take 1 tablet (100 mg total) by mouth 2 (two) times daily. 10/26/20   Thomasene Ripple, DO  ?Naldemedine Tosylate (SYMPROIC) 0.2 MG TABS Take 0.2 mg by mouth daily as needed (constipation).    [provider]  ?Naphazoline HCl (CLEAR EYES OP) Place 1 drop into both eyes daily as needed (itchy eyes).    [provider]  ?nitroGLYCERIN (NITROSTAT) 0.4 MG SL tablet PLACE 1 TABLET UNDER THE TONGUE EVERY 5 MINUTES AS NEEDED FOR CHEST PAIN 01/14/22   Tobb, Kardie,  DO  ?NUPLAZID 34 MG CAPS Take 1 capsule by mouth daily. 09/12/20   [provider]  ?OVER THE COUNTER MEDICATION Place 1 spray in ear(s) daily as needed (itchy ears).    [provider]  ?oxyCODONE (ROXICODONE) 15 MG immediate release tablet Take 15 mg by mouth 5 (five) times daily as needed for pain. 03/27/21   [provider]  ?potassium chloride (KLOR-CON) 10 MEQ tablet Take 1 tablet by mouth daily 10/26/20   Tobb, Kardie, DO  ?pregabalin (LYRICA) 300 MG capsule Take 300 mg by mouth 2 (two) times daily. 07/28/19   [provider]  ?promethazine (PHENERGAN) 25 MG tablet Take 25 mg by mouth every 6 (six) hours as needed for nausea or vomiting.    [provider]  ?ranolazine (RANEXA) 1000 MG SR tablet Take 1 tablet (1,000 mg total) by mouth 2 (two) times daily. 02/27/22   Tobb, Kardie, DO  ?rosuvastatin (CRESTOR) 20 MG tablet Take 1 tablet (20 mg total) by mouth daily. 01/14/22 04/14/22  Tobb, Lavona Mound, DO  ?testosterone cypionate (DEPOTESTOSTERONE CYPIONATE) 200 MG/ML injection Inject 200 mg into the muscle every 14 (fourteen) days. 03/13/20   [provider]  ?TRESIBA FLEXTOUCH 100 UNIT/ML FlexTouch Pen Inject 22 Units into the skin at bedtime. 03/22/21   [provider]  ?   ? ?Allergies    ?Naproxen sodium and Betadine [povidone iodine]   ? ?Review of Systems   ?Review of Systems  ?Musculoskeletal:  Positive for back pain and neck pain.  ?All other systems reviewed and are negative. ? ?Physical Exam ?Updated Vital Signs ?BP (!) 166/80 (BP Location: Left Arm)   Pulse 74   Temp 98.4 ?F (36.9 ?C)   Resp 18   Ht 5\' 10"  (1.778 m)   Wt 79.8 kg   SpO2 98%   BMI 25.25 kg/m?  ?Physical Exam ?Vitals and nursing note reviewed.  ?Constitutional:   ?   Comments: Uncomfortable  ?HENT:  ?   Head: Normocephalic.  ?   Nose: Nose normal.  ?   Mouth/Throat:  ?   Mouth: Mucous membranes are moist.  ?Eyes:  ?   Extraocular Movements: Extraocular movements intact.  ?    Pupils: Pupils are equal, round, and reactive to light.  ?Neck:  ?   Comments: Mild right para cervical tenderness but no midline tenderness ?Cardiovascular:  ?   Rate and Rhythm: Normal rate and regular rhythm.  ?   Pulses: Normal pulses.  ?   Heart sounds: Normal heart sounds.  ?Pulmonary:  ?   Effort: Pulmonary effort is normal.  ?   Breath sounds: Normal breath sounds.  ?Abdominal:  ?   General: Abdomen is flat.  ?Musculoskeletal:  ?   Cervical back: Normal range of motion and neck supple.  ?   Comments:  Right paralumbar tenderness.  Patient has positive straight leg raise on the right side.  No saddle anesthesia.  ?Skin: ?   General: Skin is warm.  ?   Capillary Refill: Capillary refill takes less than 2 seconds.  ?Neurological:  ?   General: No focal deficit present.  ?   Mental Status: He is oriented to person, place, and time.  ?   Comments: No saddle anesthesia  ?Psychiatric:     ?   Mood and Affect: Mood normal.     ?   Behavior: Behavior normal.  ? ? ?ED Results / Procedures / Treatments   ?Labs ?(all labs ordered are listed, but only abnormal results are displayed) ?Labs Reviewed - No data to display ? ?EKG ?None ? ?Radiology ?No results found. ? ?Procedures ?Procedures  ? ? ?Medications Ordered in ED ?Medications - No data to display ? ?ED Course/ Medical Decision Making/ A&P ?  ?                        ?Medical Decision Making ?Charles Huynh is a 61 y.o. male presenting with fall.  Patient fell about a week ago.  Patient has worsening back pain and neck pain.  Patient had no injury to the neck but felt a pop in his neck when he fell.  Patient is neurovascularly intact and has no saddle anesthesia.  We will get CT cervical spine and CT lumbar spine.  We will hold off on MRI right now.  Offered pain meds but he states that he has oxycodone and baclofen.  ? ?4:04 PM ?CT showed airspace disease L5-S1 and patient has previous anterior discectomy and fusion of the cervical spine.  Patient has neurosurgery  follow-up.  Also has pain medicine at home.  States that he will follow-up with his doctor ? ?Problems Addressed: ?Radiculopathy, lumbar region: acute illness or injury ?S/P cervical spinal fusion: acute illness

## 2022-03-12 NOTE — Discharge Instructions (Signed)
Your CT did not show any acute fractures. ? ?You likely have radiculopathy from pinched nerve in your back. ? ?Please continue your current pain medicine and muscle relaxant ? ?Follow-up with your pain management doctor and your neurosurgeon ? ?Return to ER if you have worse back pain, neck pain, numbness or weakness ?

## 2022-03-12 NOTE — ED Notes (Signed)
AVS provided to client, discussed follow up with his pain management team, also discussed alternative pain management techniques. Opportunity for questions provided prior to DC to home with family  ?

## 2022-03-12 NOTE — ED Triage Notes (Addendum)
Pt reports he fell from a standing position ~7-8 days ago-c/o pain to lower back and posterior neck, left UE and bilat shoulders, bilat LE and left foot-reports hx of multiple falls r/t Parkinson-NAD-steady slow gait ?

## 2022-03-13 DIAGNOSIS — G9340 Encephalopathy, unspecified: Secondary | ICD-10-CM | POA: Diagnosis present

## 2022-03-13 DIAGNOSIS — R569 Unspecified convulsions: Secondary | ICD-10-CM | POA: Insufficient documentation

## 2022-05-19 ENCOUNTER — Telehealth: Payer: Self-pay

## 2022-05-19 ENCOUNTER — Encounter: Payer: Self-pay | Admitting: Cardiology

## 2022-05-19 ENCOUNTER — Telehealth (INDEPENDENT_AMBULATORY_CARE_PROVIDER_SITE_OTHER): Payer: Medicare Other | Admitting: Cardiology

## 2022-05-19 DIAGNOSIS — R5383 Other fatigue: Secondary | ICD-10-CM | POA: Diagnosis not present

## 2022-05-19 DIAGNOSIS — R0683 Snoring: Secondary | ICD-10-CM | POA: Diagnosis not present

## 2022-05-19 DIAGNOSIS — I25118 Atherosclerotic heart disease of native coronary artery with other forms of angina pectoris: Secondary | ICD-10-CM

## 2022-05-19 DIAGNOSIS — R400 Somnolence: Secondary | ICD-10-CM | POA: Diagnosis not present

## 2022-05-19 DIAGNOSIS — I480 Paroxysmal atrial fibrillation: Secondary | ICD-10-CM

## 2022-05-19 NOTE — Patient Instructions (Addendum)
Medication Instructions:  Your physician recommends that you continue on your current medications as directed. Please refer to the Current Medication list given to you today.  *If you need a refill on your cardiac medications before your next appointment, please call your pharmacy*   Lab Work: None If you have labs (blood work) drawn today and your tests are completely normal, you will receive your results only by: MyChart Message (if you have MyChart) OR A paper copy in the mail If you have any lab test that is abnormal or we need to change your treatment, we will call you to review the results.   Testing/Procedures: Your physician has recommended that you have a sleep study. This test records several body functions during sleep, including: brain activity, eye movement, oxygen and carbon dioxide blood levels, heart rate and rhythm, breathing rate and rhythm, the flow of air through your mouth and nose, snoring, body muscle movements, and chest and belly movement.    Follow-Up: At [facility], you and your health needs are our priority.  As part of our continuing mission to provide you with exceptional heart care, we have created designated Provider Care Teams.  These Care Teams include your primary Cardiologist (physician) and Advanced Practice Providers (APPs -  Physician Assistants and Nurse Practitioners) who all work together to provide you with the care you need, when you need it.  We recommend signing up for the patient portal called "MyChart".  Sign up information is provided on this After Visit Summary.  MyChart is used to connect with patients for Virtual Visits (Telemedicine).  Patients are able to view lab/test results, encounter notes, upcoming appointments, etc.  Non-urgent messages can be sent to your provider as well.   To learn more about what you can do with MyChart, go to [email or web address].    Your next appointment:   12 week(s)  The format for your next  appointment:   In Person  Provider:   Thomasene Ripple, DO     Other Instructions   Important Information About Sugar

## 2022-05-19 NOTE — Progress Notes (Signed)
Cardiology Office Note:    Date:  05/19/2022   ID:  Charles Huynh, DOB 17-Mar-1961, MRN [id number]  PCP:  Verdis Prime, PA-C  Cardiologist:  Thomasene Ripple, DO  Electrophysiologist:  None   Referring MD: Verdis Prime, PA-C     History of Present Illness:    Charles Huynh is a 61 y.o. male with a hx of moderate coronary artery disease seen on coronary CT scan, paroxysmal atrial fibrillation on Eliquis, TIA, CVA, here today for follow-up visit.   At his last visit on January 31st, 2023 he was experiencing significant chest pain.  I added Ranexa 500 mg twice daily and set the patient up for heart catheterization.  He had his heart catheterization no PCI indicated.  Since his last visit he had a emergency department evaluation on March 12, 2022 where he presented for back and neck pain post fall.  He was treated and discharged home that day.  Then again on May 13, 2022 he was found by his wife suspected altered mental status concerning for seizure he was admitted at [facility] where he was treated for aspiration pneumonia, altered mental status, baclofen overdose.  Since that visit he has seen his PCP who also had treated the patient for chronic bronchitis.   He tells me recently he has been waking up with intermittent shortness of breath.  He feels that he needed to take deep breaths.  In addition he admits to fatigue, and his wife said his snoring has worsened and some daytime somnolence.  No other complaints at this time.  Past Medical History:  Diagnosis Date   Anxiety    Atrial fibrillation (HCC)    Barrett esophagus    Benign prostatic hyperplasia with urinary obstruction 12/28/2015   Cerebrovascular accident (HCC) 07/09/2017   Chronic pain of both shoulders 04/08/2021   Chronic pain syndrome 07/09/2017   Clotting disorder (HCC)    Coronary artery disease of native artery of native heart with stable angina pectoris (HCC) 01/03/2020   Decreased testosterone level  07/09/2017   Depression    Diabetes (HCC)    Dyslipidemia 03/11/2021   Dysphagia 03/11/2021   Dyspnea 03/11/2021   Essential hypertension 08/30/2019   Fatigue 03/11/2021   Gastroesophageal reflux disease without esophagitis 09/02/2017   Gastroparesis 01/24/2021   Heart disease    History of ulcer disease    Hx pulmonary embolism    Hyperlipidemia    Hypertension    Hypogonadism in male 03/11/2021   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 02/06/2021   Hypothyroidism 07/09/2017   Intermittent palpitations 03/11/2021   Mild episode of recurrent major depressive disorder (HCC) 01/24/2021   Mixed hyperlipidemia 01/03/2020   Nausea 03/11/2021   Nicotine dependence 03/11/2021   PAF (paroxysmal atrial fibrillation) (HCC) 01/03/2020   Parkinson's disease (HCC)    Shoulder pain 03/11/2021   Smoker 03/11/2021   Spasm of back muscles 03/11/2021   Stroke [facility])    Thyroid disease    Type 2 diabetes mellitus without complication (HCC) 07/09/2017   Wound of skin 03/11/2021    Past Surgical History:  Procedure Laterality Date   BACK SURGERY     CARDIAC CATHETERIZATION     COLONOSCOPY  2019   have a small polyp [place] Dr Philis Kendall   ESOPHAGOGASTRODUODENOSCOPY  2019   Dr Philis Kendall. [place]   INTRAVASCULAR PRESSURE WIRE/FFR STUDY N/A 01/22/2022   Procedure: INTRAVASCULAR PRESSURE WIRE/FFR STUDY;  Surgeon: Marykay Lex, MD;  Location: [facility] INVASIVE CV  LAB;  Service: Cardiovascular;  Laterality: N/A;   KNEE ARTHROSCOPY Left    LEFT HEART CATH AND CORONARY ANGIOGRAPHY N/A 01/22/2022   Procedure: LEFT HEART CATH AND CORONARY ANGIOGRAPHY;  Surgeon: Marykay Lex, MD;  Location: [facility] INVASIVE CV LAB;  Service: Cardiovascular;  Laterality: N/A;   NECK SURGERY     SPINAL CORD STIMULATOR INSERTION     WRIST SURGERY Bilateral     Current Medications: Current Meds  Medication Sig   albuterol (VENTOLIN HFA) 108 (90 Base) MCG/ACT inhaler Inhale 2 puffs into the lungs every 4 (four) hours as needed for wheezing or  shortness of breath.   baclofen (LIORESAL) 10 MG tablet Take 10 mg by mouth 3 (three) times daily as needed for muscle spasms.   bismuth subsalicylate (PEPTO BISMOL) 262 MG/15ML suspension Take 30 mLs by mouth every 6 (six) hours as needed for indigestion.   carbidopa-levodopa (SINEMET IR) 25-250 MG tablet Take 2 tablets by mouth 2 (two) times daily.   citalopram (CELEXA) 20 MG tablet Take 20 mg by mouth daily.   D3-50 1.25 MG (50000 UT) capsule Take 50,000 Units by mouth every Sunday.   DILT-XR 120 MG 24 hr capsule Take 120 mg by mouth daily.   diphenhydrAMINE (BENADRYL) 25 MG tablet Take 25 mg by mouth daily as needed for allergies.   ELIQUIS 5 MG TABS tablet Take 1 tablet (5 mg total) by mouth 2 (two) times daily.   esomeprazole (NEXIUM) 40 MG capsule Take 40 mg by mouth daily.   fentaNYL (DURAGESIC) 25 MCG/HR Place 1 patch onto the skin as directed. Apply every 2.5 days   hydrochlorothiazide (MICROZIDE) 12.5 MG capsule Take 1 capsule (12.5 mg total) by mouth daily. (Patient taking differently: Take 12.5 mg by mouth every Monday, Wednesday, and Friday.)   hydrocortisone cream 1 % Apply 1 application topically 2 (two) times daily as needed for itching.   isosorbide mononitrate (IMDUR) 60 MG 24 hr tablet TAKE 1 TABLET(60 MG) BY MOUTH DAILY   JANUVIA 100 MG tablet Take 100 mg by mouth daily.   JARDIANCE 25 MG TABS tablet Take 25 mg by mouth daily.   levothyroxine (SYNTHROID) 75 MCG tablet Take 75 mcg by mouth daily.   Menthol, Topical Analgesic, (BIOFREEZE EX) Apply 1 application topically daily as needed (shoulder pain).   metFORMIN (GLUCOPHAGE-XR) 500 MG 24 hr tablet Take 1,000 mg by mouth daily.   metoCLOPramide (REGLAN) 10 MG tablet Take 10 mg by mouth 2 (two) times daily.   metoprolol tartrate (LOPRESSOR) 100 MG tablet Take 1 tablet (100 mg total) by mouth 2 (two) times daily.   Naldemedine Tosylate (SYMPROIC) 0.2 MG TABS Take 0.2 mg by mouth daily as needed (constipation).   Naphazoline  HCl (CLEAR EYES OP) Place 1 drop into both eyes daily as needed (itchy eyes).   nitroGLYCERIN (NITROSTAT) 0.4 MG SL tablet PLACE 1 TABLET UNDER THE TONGUE EVERY 5 MINUTES AS NEEDED FOR CHEST PAIN   NUPLAZID 34 MG CAPS Take 1 capsule by mouth daily.   OVER THE COUNTER MEDICATION Place 1 spray in ear(s) daily as needed (itchy ears).   oxyCODONE (ROXICODONE) 15 MG immediate release tablet Take 15 mg by mouth 5 (five) times daily as needed for pain.   potassium chloride (KLOR-CON) 10 MEQ tablet Take 1 tablet by mouth daily   pregabalin (LYRICA) 300 MG capsule Take 300 mg by mouth 2 (two) times daily.   promethazine (PHENERGAN) 25 MG tablet Take 25 mg by mouth every 6 (six)  hours as needed for nausea or vomiting.   ranolazine (RANEXA) 1000 MG SR tablet Take 1 tablet (1,000 mg total) by mouth 2 (two) times daily.   rosuvastatin (CRESTOR) 20 MG tablet Take 1 tablet (20 mg total) by mouth daily.   testosterone cypionate (DEPOTESTOSTERONE CYPIONATE) 200 MG/ML injection Inject 200 mg into the muscle every 14 (fourteen) days.   TRESIBA FLEXTOUCH 100 UNIT/ML FlexTouch Pen Inject 22 Units into the skin at bedtime.     Allergies:   Naproxen sodium and Betadine [povidone iodine]   Social History   Socioeconomic History   Marital status: Married    Spouse name: Not on file   Number of children: Not on file   Years of education: Not on file   Highest education level: Not on file  Occupational History   Not on file  Tobacco Use   Smoking status: Every Day    Packs/day: 1.00    Years: 47.00    Pack years: 47.00    Types: Cigarettes   Smokeless tobacco: Never  Vaping Use   Vaping Use: Never used  Substance and Sexual Activity   Alcohol use: Not Currently   Drug use: Never   Sexual activity: Not on file  Other Topics Concern   Not on file  Social History Narrative   Not on file   Social Determinants of Health   Financial Resource Strain: Not on file  Food Insecurity: Not on file   Transportation Needs: Not on file  Physical Activity: Not on file  Stress: Not on file  Social Connections: Not on file     Family History: The patient's family history includes Emphysema in his father; Heart attack in his father; Heart disease in his father; Lung cancer in his mother. There is no history of Colon cancer, Esophageal cancer, Stomach cancer, or Rectal cancer.  ROS:   Review of Systems  Constitution: Reports fatigue.  Negative for decreased appetite, fever and weight gain.  HENT: Negative for congestion, ear discharge, hoarse voice and sore throat.   Eyes: Negative for discharge, redness, vision loss in right eye and visual halos.  Cardiovascular: Negative for chest pain, dyspnea on exertion, leg swelling, orthopnea and palpitations.  Respiratory: Reports snoring an intermittent shortness of breath.  Negative for cough, hemoptysis.  Endocrine: Negative for heat intolerance and polyphagia.  Hematologic/Lymphatic: Negative for bleeding problem. Does not bruise/bleed easily.  Skin: Negative for flushing, nail changes, rash and suspicious lesions.  Musculoskeletal: Negative for arthritis, joint pain, muscle cramps, myalgias, neck pain and stiffness.  Gastrointestinal: Negative for abdominal pain, bowel incontinence, diarrhea and excessive appetite.  Genitourinary: Negative for decreased libido, genital sores and incomplete emptying.  Neurological: Negative for brief paralysis, focal weakness, headaches and loss of balance.  Psychiatric/Behavioral: Negative for altered mental status, depression and suicidal ideas.  Allergic/Immunologic: Negative for HIV exposure and persistent infections.    EKGs/Labs/Other Studies Reviewed:    The following studies were reviewed today:   EKG: None today  Recent Labs: 01/14/2022: Magnesium 2.2 01/23/2022: BUN 17; Creatinine, Ser 0.95; Hemoglobin 17.3; Hemoglobin 17.4; Platelets 251; Platelets 239; Potassium 3.9; Sodium 136  Recent Lipid  Panel No results found for: CHOL, TRIG, HDL, CHOLHDL, VLDL, LDLCALC, LDLDIRECT  Physical Exam:    VS:  There were no vitals taken for this visit.    Wt Readings from Last 3 Encounters:  03/12/22 176 lb (79.8 kg)  02/27/22 179 lb (81.2 kg)  01/23/22 164 lb 0.4 oz (74.4 kg)  Virtual visit physical exam not performed.  ASSESSMENT:    1. Fatigue, unspecified type   2. Daytime somnolence   3. Snoring   4. Coronary artery disease of native artery of native heart with stable angina pectoris (HCC)   5. PAF (paroxysmal atrial fibrillation) (HCC)    PLAN:     Coronary artery disease-no anginal symptoms continue patient on current medication. Paroxysmal atrial fibrillation continue rate control agents as well as Eliquis. Hypertension-he did not give me his vitals today via the virtual visit hopefully he still at target but will continue current medication.  Daytime somnolence/snoring-we will send the patient for sleep study.  Shortness of breath we will continue to monitor closely suspect this is more respiratory in nature.  But we will bring him back in 12 weeks for reevaluation.  The patient is in agreement with the above plan. The patient left the office in stable condition.  The patient will follow up in 12 weeks   Medication Adjustments/Labs and Tests Ordered: Current medicines are reviewed at length with the patient today.  Concerns regarding medicines are outlined above.  Orders Placed This Encounter  Procedures   Split night study   No orders of the defined types were placed in this encounter.   Patient Instructions  Medication Instructions:  Your physician recommends that you continue on your current medications as directed. Please refer to the Current Medication list given to you today.  *If you need a refill on your cardiac medications before your next appointment, please call your pharmacy*   Lab Work: None If you have labs (blood work) drawn today and your  tests are completely normal, you will receive your results only by: MyChart Message (if you have MyChart) OR A paper copy in the mail If you have any lab test that is abnormal or we need to change your treatment, we will call you to review the results.   Testing/Procedures: Your physician has recommended that you have a sleep study. This test records several body functions during sleep, including: brain activity, eye movement, oxygen and carbon dioxide blood levels, heart rate and rhythm, breathing rate and rhythm, the flow of air through your mouth and nose, snoring, body muscle movements, and chest and belly movement.    Follow-Up: At [facility], you and your health needs are our priority.  As part of our continuing mission to provide you with exceptional heart care, we have created designated Provider Care Teams.  These Care Teams include your primary Cardiologist (physician) and Advanced Practice Providers (APPs -  Physician Assistants and Nurse Practitioners) who all work together to provide you with the care you need, when you need it.  We recommend signing up for the patient portal called "MyChart".  Sign up information is provided on this After Visit Summary.  MyChart is used to connect with patients for Virtual Visits (Telemedicine).  Patients are able to view lab/test results, encounter notes, upcoming appointments, etc.  Non-urgent messages can be sent to your provider as well.   To learn more about what you can do with MyChart, go to [email or web address].    Your next appointment:   12 week(s)  The format for your next appointment:   In Person  Provider:   Thomasene Ripple, DO     Other Instructions   Important Information About Sugar         Adopting a Healthy Lifestyle.  Know what a healthy weight is for you (roughly BMI <25) and aim to maintain  this   Aim for 7+ servings of fruits and vegetables daily   65-80+ fluid ounces of water or unsweet tea for  healthy kidneys   Limit to max 1 drink of alcohol per day; avoid smoking/tobacco   Limit animal fats in diet for cholesterol and heart health - choose grass fed whenever available   Avoid highly processed foods, and foods high in saturated/trans fats   Aim for low stress - take time to unwind and care for your mental health   Aim for 150 min of moderate intensity exercise weekly for heart health, and weights twice weekly for bone health   Aim for 7-9 hours of sleep daily   When it comes to diets, agreement about the perfect plan isnt easy to find, even among the experts. Experts at the [employer] developed an idea known as the Healthy Eating Plate. Just imagine a plate divided into logical, healthy portions.   The emphasis is on diet quality:   Load up on vegetables and fruits - one-half of your plate: Aim for color and variety, and remember that potatoes dont count.   Go for whole grains - one-quarter of your plate: Whole wheat, barley, wheat berries, quinoa, oats, brown rice, and foods made with them. If you want pasta, go with whole wheat pasta.   Protein power - one-quarter of your plate: Fish, chicken, beans, and nuts are all healthy, versatile protein sources. Limit red meat.   The diet, however, does go beyond the plate, offering a few other suggestions.   Use healthy plant oils, such as olive, canola, soy, corn, sunflower and peanut. Check the labels, and avoid partially hydrogenated oil, which have unhealthy trans fats.   If youre thirsty, drink water. Coffee and tea are good in moderation, but skip sugary drinks and limit milk and dairy products to one or two daily servings.   The type of carbohydrate in the diet is more important than the amount. Some sources of carbohydrates, such as vegetables, fruits, whole grains, and beans-are healthier than others.   Finally, stay active  Signed, Thomasene RippleKardie Nikisha Fleece, DO  05/19/2022 9:24 AM    [facility]

## 2022-05-19 NOTE — Telephone Encounter (Signed)
Called pt to go over his AS. Spoke with his wife, she verbalized understanding. Set up his 12 week follow up appointment.

## 2022-08-21 ENCOUNTER — Ambulatory Visit: Payer: Medicare Other | Attending: Cardiology | Admitting: Cardiology

## 2022-09-11 ENCOUNTER — Other Ambulatory Visit: Payer: Self-pay | Admitting: Cardiology

## 2022-09-11 NOTE — Telephone Encounter (Signed)
Refills sent to pharmacy , with message patient needs appointment for future refills / 1st attempt

## 2022-09-19 ENCOUNTER — Other Ambulatory Visit: Payer: Self-pay | Admitting: Cardiology

## 2022-10-21 DIAGNOSIS — G40209 Localization-related (focal) (partial) symptomatic epilepsy and epileptic syndromes with complex partial seizures, not intractable, without status epilepticus: Secondary | ICD-10-CM | POA: Diagnosis present

## 2022-12-08 ENCOUNTER — Other Ambulatory Visit: Payer: Self-pay | Admitting: Cardiology

## 2023-01-15 ENCOUNTER — Telehealth: Payer: Self-pay | Admitting: Cardiology

## 2023-01-15 NOTE — Telephone Encounter (Signed)
Pt c/o Shortness Of Breath: STAT if SOB developed within the last 24 hours or pt is noticeably SOB on the phone  1. Are you currently SOB (can you hear that pt is SOB on the phone)?  No, patient states that about 3 or 4 days ago when waking up in the morning he had an episode in which he felt extremely SOB. He states it felt as if someone had a plastic bag over his head, smothering him. Patient mentions also feeling chest pressure during this episode, but no pain. He states this has occurred multiple times over the past 2-3 months.  2. How long have you been experiencing SOB?  Multiple occurrences over the past 2-3 months  3. Are you SOB when sitting or when up moving around?  Occurs when he wakes up in the morning   4. Are you currently experiencing any other symptoms?  No   Patient also mentions that his PCP would like to have him switched from Eliquis to Coumadin temporarily. He states that he is currently in the process of applying for patient assistance through a program in [place] and the switch would only take place until he is able to have Eliquis covered through patient assistance.

## 2023-01-21 ENCOUNTER — Other Ambulatory Visit: Payer: Self-pay | Admitting: Cardiology

## 2023-03-08 ENCOUNTER — Other Ambulatory Visit: Payer: Self-pay | Admitting: Cardiology

## 2023-03-09 NOTE — Telephone Encounter (Signed)
Medication has been filled 

## 2023-07-15 IMAGING — CT CT CERVICAL SPINE W/O CM
3 of 4 series · 12 of 33 positions shown, 14 images · non-contrast
Comparison: None.

CLINICAL DATA: Fall approximately a week ago, posterior neck pain,
history of multiple falls



[Series 5: sagittal bone · sagittal · 0.29mm/px · 5 of 61 slices shown, 6 images]
[im 21/61  bone]
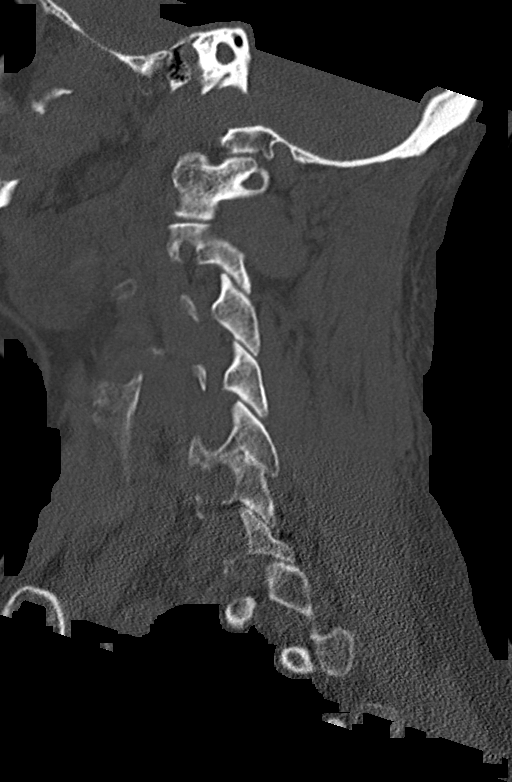
[im 26/61  bone]
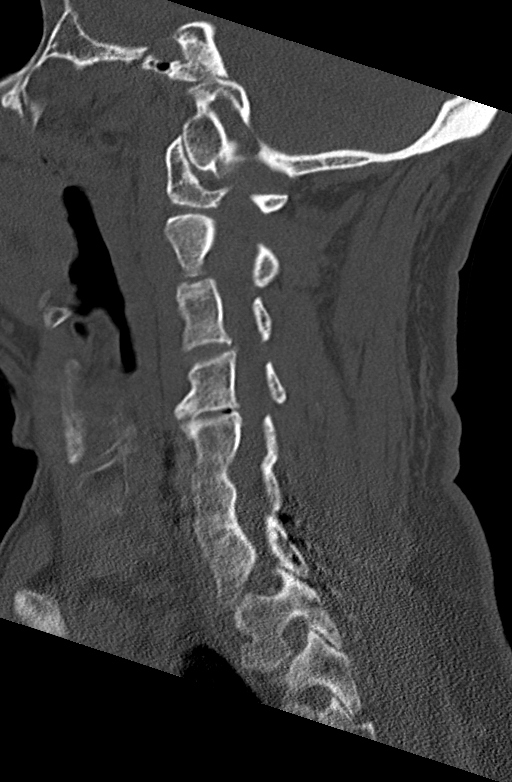
[im 31/61  soft-tissue]
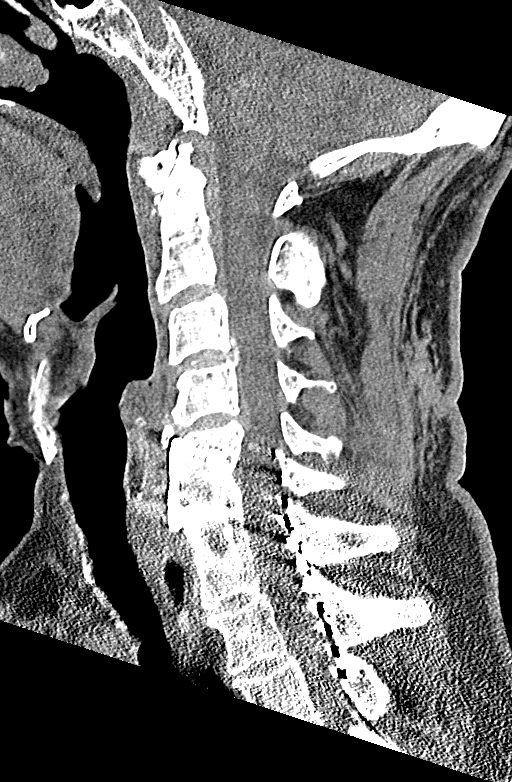
[im 31/61  bone]
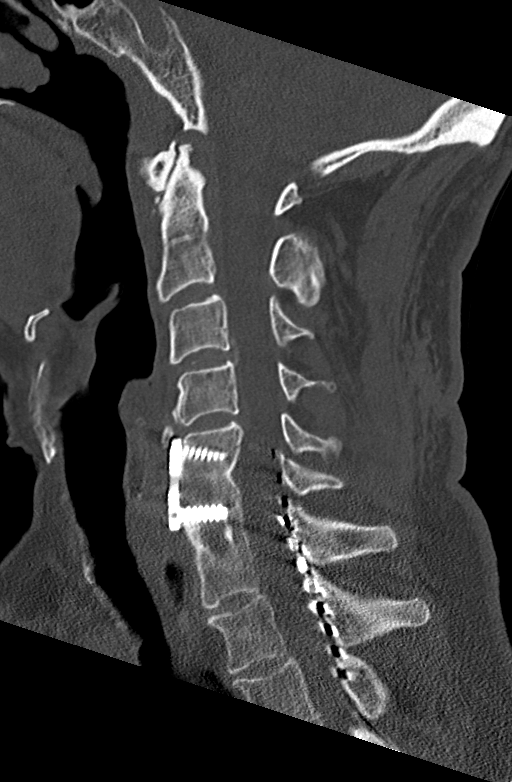
[im 36/61  bone]
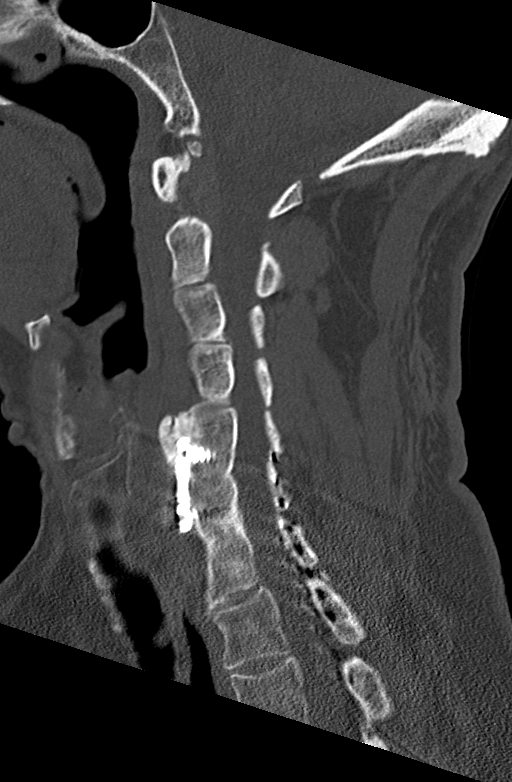
[im 41/61  bone]
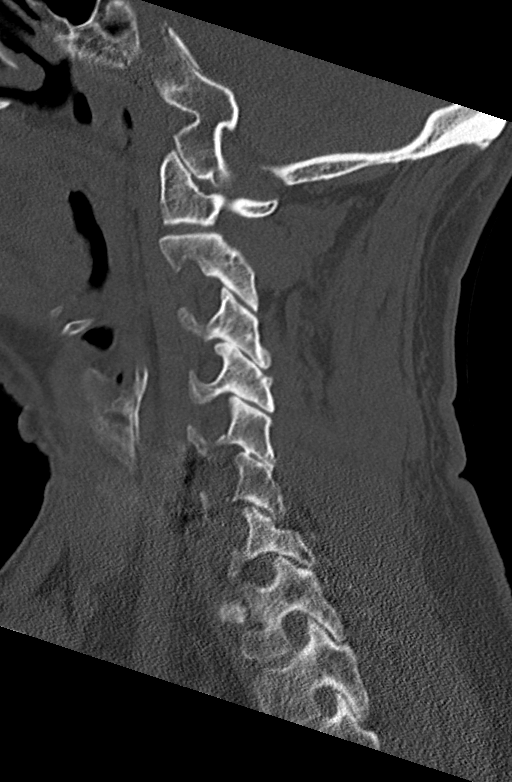

[Series 6: coronal bone · coronal · 0.30mm/px · 3 of 76 slices shown]
[im 19/76  bone]
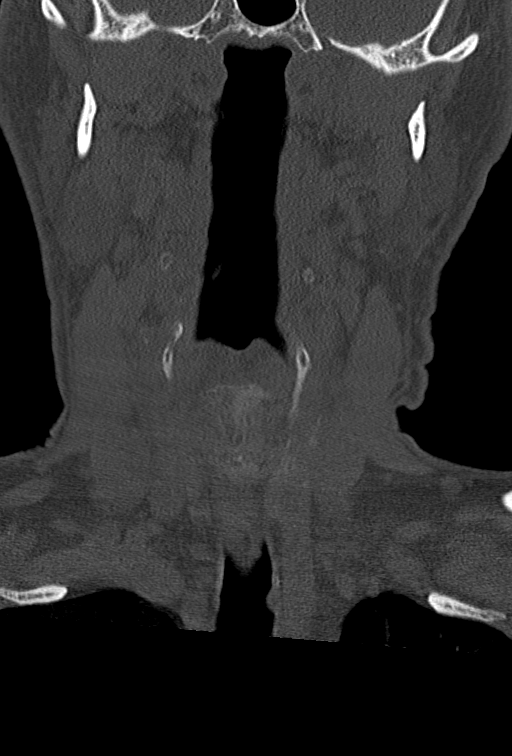
[im 32/76  bone]
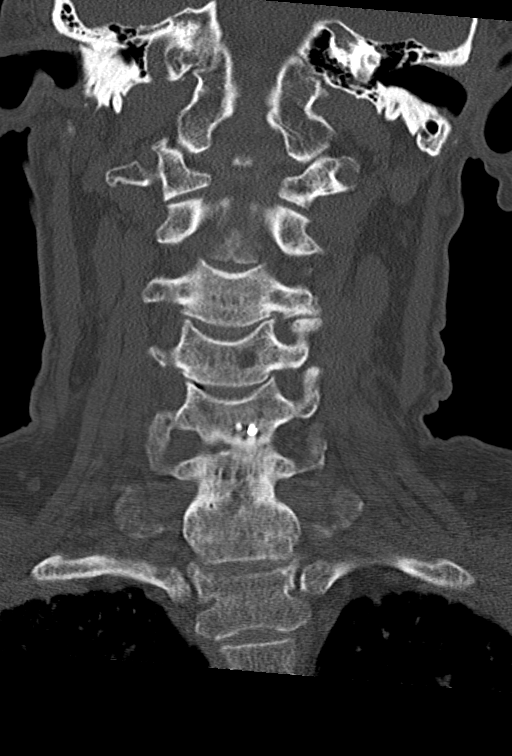
[im 44/76  bone]
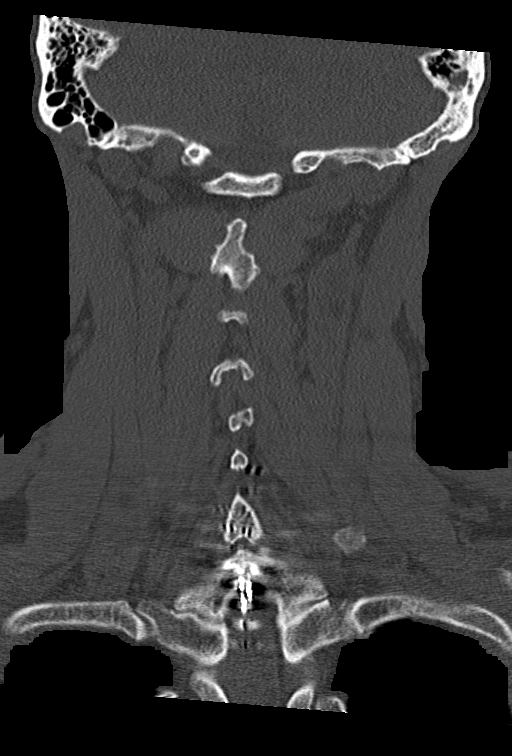

[Series 7: orthogonal bone · axial · 0.30mm/px · z∈[-251,-109]mm · 4 of 114 slices shown, 5 images]
[im 17/114  soft-tissue]
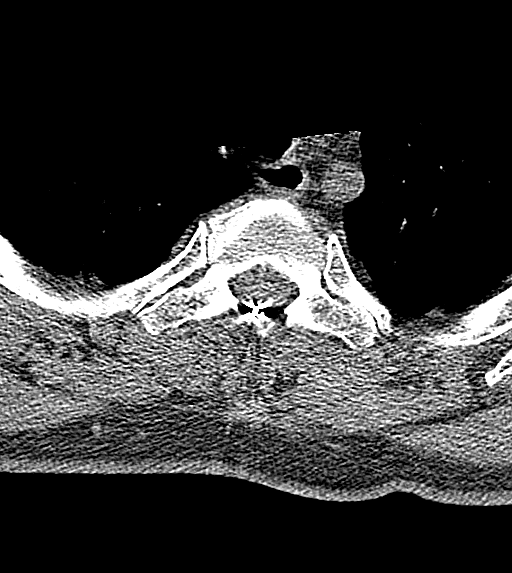
[im 17/114  bone]
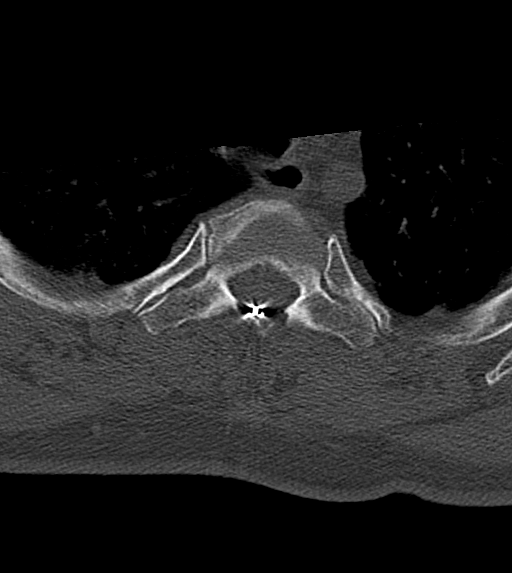
[im 49/114  bone]
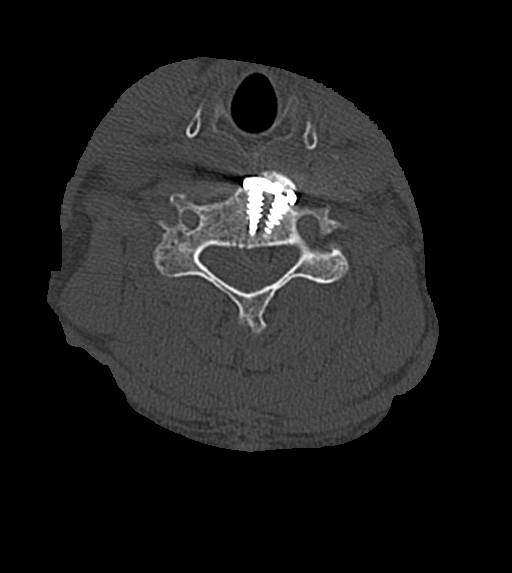
[im 65/114  bone]
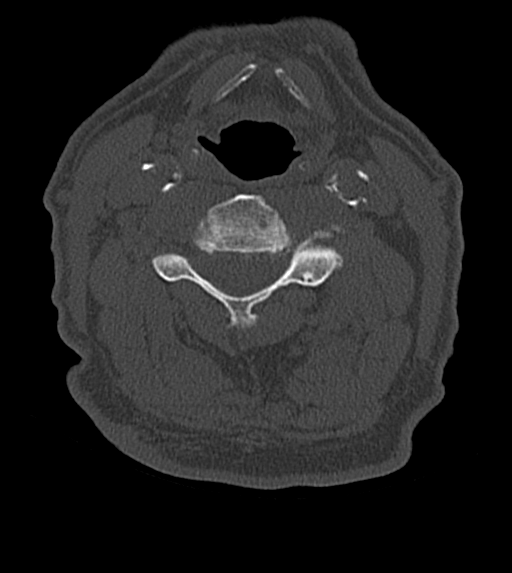
[im 97/114  bone]
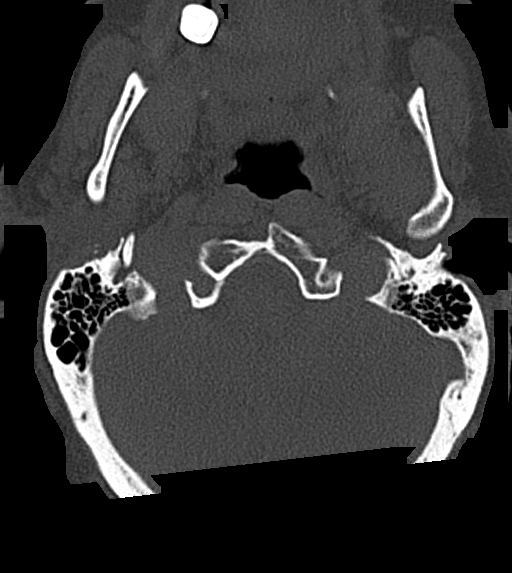

[12 of 33 positions shown; findings below may reference images not displayed]

FINDINGS: Alignment: Degenerative and postoperative straightening of the
normal cervical lordosis.

Skull base and vertebrae: No acute fracture. No primary bone lesion
or focal pathologic process.

Soft tissues and spinal canal: No prevertebral fluid or swelling. No
visible canal hematoma. Dorsal epidural stimulator lead.

Disc levels: Status post anterior cervical discectomy and fusion of
C5-C6, with additional discectomy of C6-C7 without fusion hardware,
with bony incorporation of the disc spaces.

Upper chest: Negative.

Other: None.
IMPRESSION: 1. No fracture or static subluxation of the cervical spine.
2. Status post anterior cervical discectomy and fusion of C5-C6,
with additional discectomy of C6-C7 without fusion hardware, with
bony incorporation of the disc spaces.

## 2023-10-08 DIAGNOSIS — J439 Emphysema, unspecified: Secondary | ICD-10-CM | POA: Diagnosis present

## 2024-03-19 DIAGNOSIS — I447 Left bundle-branch block, unspecified: Secondary | ICD-10-CM | POA: Diagnosis not present

## 2024-03-19 DIAGNOSIS — R0789 Other chest pain: Secondary | ICD-10-CM | POA: Diagnosis not present

## 2024-03-19 DIAGNOSIS — Z955 Presence of coronary angioplasty implant and graft: Secondary | ICD-10-CM | POA: Diagnosis not present

## 2024-03-29 ENCOUNTER — Encounter: Payer: Self-pay | Admitting: Internal Medicine

## 2024-04-14 ENCOUNTER — Encounter: Payer: Self-pay | Admitting: Gastroenterology

## 2024-06-07 ENCOUNTER — Emergency Department (HOSPITAL_COMMUNITY)

## 2024-06-07 ENCOUNTER — Encounter (HOSPITAL_COMMUNITY): Payer: Self-pay

## 2024-06-07 ENCOUNTER — Other Ambulatory Visit: Payer: Self-pay

## 2024-06-07 ENCOUNTER — Inpatient Hospital Stay (HOSPITAL_COMMUNITY)
Admission: EM | Admit: 2024-06-07 | Discharge: 2024-06-09 | DRG: 682 | Disposition: A | Discharge status: Home / Self Care | Attending: Internal Medicine | Admitting: Internal Medicine

## 2024-06-07 DIAGNOSIS — E782 Mixed hyperlipidemia: Secondary | ICD-10-CM | POA: Diagnosis present

## 2024-06-07 DIAGNOSIS — K3184 Gastroparesis: Secondary | ICD-10-CM | POA: Diagnosis present

## 2024-06-07 DIAGNOSIS — N138 Other obstructive and reflux uropathy: Secondary | ICD-10-CM | POA: Diagnosis present

## 2024-06-07 DIAGNOSIS — N289 Disorder of kidney and ureter, unspecified: Secondary | ICD-10-CM

## 2024-06-07 DIAGNOSIS — G20C Parkinsonism, unspecified: Secondary | ICD-10-CM | POA: Diagnosis not present

## 2024-06-07 DIAGNOSIS — Z7902 Long term (current) use of antithrombotics/antiplatelets: Secondary | ICD-10-CM

## 2024-06-07 DIAGNOSIS — E039 Hypothyroidism, unspecified: Secondary | ICD-10-CM | POA: Diagnosis present

## 2024-06-07 DIAGNOSIS — R410 Disorientation, unspecified: Principal | ICD-10-CM

## 2024-06-07 DIAGNOSIS — I480 Paroxysmal atrial fibrillation: Secondary | ICD-10-CM | POA: Diagnosis present

## 2024-06-07 DIAGNOSIS — G9340 Encephalopathy, unspecified: Secondary | ICD-10-CM | POA: Diagnosis not present

## 2024-06-07 DIAGNOSIS — Z7951 Long term (current) use of inhaled steroids: Secondary | ICD-10-CM

## 2024-06-07 DIAGNOSIS — G40209 Localization-related (focal) (partial) symptomatic epilepsy and epileptic syndromes with complex partial seizures, not intractable, without status epilepticus: Secondary | ICD-10-CM | POA: Diagnosis present

## 2024-06-07 DIAGNOSIS — Z86711 Personal history of pulmonary embolism: Secondary | ICD-10-CM

## 2024-06-07 DIAGNOSIS — G894 Chronic pain syndrome: Secondary | ICD-10-CM | POA: Diagnosis present

## 2024-06-07 DIAGNOSIS — G9341 Metabolic encephalopathy: Secondary | ICD-10-CM | POA: Diagnosis present

## 2024-06-07 DIAGNOSIS — J439 Emphysema, unspecified: Secondary | ICD-10-CM | POA: Diagnosis present

## 2024-06-07 DIAGNOSIS — I959 Hypotension, unspecified: Secondary | ICD-10-CM | POA: Diagnosis present

## 2024-06-07 DIAGNOSIS — E1143 Type 2 diabetes mellitus with diabetic autonomic (poly)neuropathy: Secondary | ICD-10-CM | POA: Diagnosis present

## 2024-06-07 DIAGNOSIS — N179 Acute kidney failure, unspecified: Secondary | ICD-10-CM | POA: Diagnosis not present

## 2024-06-07 DIAGNOSIS — Z955 Presence of coronary angioplasty implant and graft: Secondary | ICD-10-CM

## 2024-06-07 DIAGNOSIS — Z7901 Long term (current) use of anticoagulants: Secondary | ICD-10-CM

## 2024-06-07 DIAGNOSIS — Z7984 Long term (current) use of oral hypoglycemic drugs: Secondary | ICD-10-CM

## 2024-06-07 DIAGNOSIS — I10 Essential (primary) hypertension: Secondary | ICD-10-CM | POA: Diagnosis present

## 2024-06-07 DIAGNOSIS — R4182 Altered mental status, unspecified: Secondary | ICD-10-CM

## 2024-06-07 DIAGNOSIS — E860 Dehydration: Secondary | ICD-10-CM | POA: Diagnosis present

## 2024-06-07 DIAGNOSIS — R531 Weakness: Secondary | ICD-10-CM

## 2024-06-07 DIAGNOSIS — Z79899 Other long term (current) drug therapy: Secondary | ICD-10-CM

## 2024-06-07 DIAGNOSIS — Z8673 Personal history of transient ischemic attack (TIA), and cerebral infarction without residual deficits: Secondary | ICD-10-CM

## 2024-06-07 DIAGNOSIS — Z8249 Family history of ischemic heart disease and other diseases of the circulatory system: Secondary | ICD-10-CM

## 2024-06-07 DIAGNOSIS — J209 Acute bronchitis, unspecified: Secondary | ICD-10-CM | POA: Insufficient documentation

## 2024-06-07 DIAGNOSIS — G20A1 Parkinson's disease without dyskinesia, without mention of fluctuations: Secondary | ICD-10-CM | POA: Diagnosis present

## 2024-06-07 DIAGNOSIS — F17210 Nicotine dependence, cigarettes, uncomplicated: Secondary | ICD-10-CM | POA: Diagnosis present

## 2024-06-07 DIAGNOSIS — E785 Hyperlipidemia, unspecified: Secondary | ICD-10-CM | POA: Diagnosis present

## 2024-06-07 DIAGNOSIS — Z860100 Personal history of colon polyps, unspecified: Secondary | ICD-10-CM

## 2024-06-07 DIAGNOSIS — Z801 Family history of malignant neoplasm of trachea, bronchus and lung: Secondary | ICD-10-CM

## 2024-06-07 DIAGNOSIS — J4489 Other specified chronic obstructive pulmonary disease: Secondary | ICD-10-CM | POA: Diagnosis present

## 2024-06-07 DIAGNOSIS — Z883 Allergy status to other anti-infective agents status: Secondary | ICD-10-CM

## 2024-06-07 DIAGNOSIS — F419 Anxiety disorder, unspecified: Secondary | ICD-10-CM | POA: Diagnosis present

## 2024-06-07 DIAGNOSIS — E871 Hypo-osmolality and hyponatremia: Secondary | ICD-10-CM | POA: Diagnosis present

## 2024-06-07 DIAGNOSIS — Z79890 Hormone replacement therapy (postmenopausal): Secondary | ICD-10-CM

## 2024-06-07 DIAGNOSIS — N401 Enlarged prostate with lower urinary tract symptoms: Secondary | ICD-10-CM | POA: Diagnosis present

## 2024-06-07 DIAGNOSIS — Z886 Allergy status to analgesic agent status: Secondary | ICD-10-CM

## 2024-06-07 DIAGNOSIS — E119 Type 2 diabetes mellitus without complications: Secondary | ICD-10-CM

## 2024-06-07 DIAGNOSIS — Z825 Family history of asthma and other chronic lower respiratory diseases: Secondary | ICD-10-CM

## 2024-06-07 DIAGNOSIS — K219 Gastro-esophageal reflux disease without esophagitis: Secondary | ICD-10-CM | POA: Diagnosis present

## 2024-06-07 DIAGNOSIS — F330 Major depressive disorder, recurrent, mild: Secondary | ICD-10-CM | POA: Diagnosis present

## 2024-06-07 DIAGNOSIS — E872 Acidosis, unspecified: Secondary | ICD-10-CM | POA: Diagnosis present

## 2024-06-07 DIAGNOSIS — I25118 Atherosclerotic heart disease of native coronary artery with other forms of angina pectoris: Secondary | ICD-10-CM | POA: Diagnosis present

## 2024-06-07 LAB — I-STAT CHEM 8, ED
BUN: 54 mg/dL — ABNORMAL HIGH (ref 8–23)
Calcium, Ion: 1.12 mmol/L — ABNORMAL LOW (ref 1.15–1.40)
Chloride: 101 mmol/L (ref 98–111)
Creatinine, Ser: 1.6 mg/dL — ABNORMAL HIGH (ref 0.61–1.24)
Glucose, Bld: 216 mg/dL — ABNORMAL HIGH (ref 70–99)
HCT: 48 % (ref 39.0–52.0)
Hemoglobin: 16.3 g/dL (ref 13.0–17.0)
Potassium: 6.8 mmol/L (ref 3.5–5.1)
Sodium: 133 mmol/L — ABNORMAL LOW (ref 135–145)
TCO2: 27 mmol/L (ref 22–32)

## 2024-06-07 LAB — RAPID URINE DRUG SCREEN, HOSP PERFORMED
Amphetamines: NOT DETECTED
Barbiturates: NOT DETECTED
Benzodiazepines: NOT DETECTED
Cocaine: NOT DETECTED
Opiates: POSITIVE — AB
Tetrahydrocannabinol: NOT DETECTED

## 2024-06-07 LAB — LACTIC ACID, PLASMA
Lactic Acid, Venous: 1 mmol/L (ref 0.5–1.9)
Lactic Acid, Venous: 1.9 mmol/L (ref 0.5–1.9)

## 2024-06-07 LAB — APTT: aPTT: 28 s (ref 24–36)

## 2024-06-07 LAB — DIFFERENTIAL
Abs Immature Granulocytes: 0.05 10*3/uL (ref 0.00–0.07)
Basophils Absolute: 0 10*3/uL (ref 0.0–0.1)
Basophils Relative: 0 %
Eosinophils Absolute: 0.2 10*3/uL (ref 0.0–0.5)
Eosinophils Relative: 1 %
Immature Granulocytes: 0 %
Lymphocytes Relative: 20 %
Lymphs Abs: 2.5 10*3/uL (ref 0.7–4.0)
Monocytes Absolute: 0.9 10*3/uL (ref 0.1–1.0)
Monocytes Relative: 7 %
Neutro Abs: 8.8 10*3/uL — ABNORMAL HIGH (ref 1.7–7.7)
Neutrophils Relative %: 72 %

## 2024-06-07 LAB — CBC
HCT: 48.3 % (ref 39.0–52.0)
Hemoglobin: 16.2 g/dL (ref 13.0–17.0)
MCH: 28.9 pg (ref 26.0–34.0)
MCHC: 33.5 g/dL (ref 30.0–36.0)
MCV: 86.3 fL (ref 80.0–100.0)
Platelets: 239 10*3/uL (ref 150–400)
RBC: 5.6 MIL/uL (ref 4.22–5.81)
RDW: 12.5 % (ref 11.5–15.5)
WBC: 12.3 10*3/uL — ABNORMAL HIGH (ref 4.0–10.5)
nRBC: 0 % (ref 0.0–0.2)

## 2024-06-07 LAB — TSH: TSH: 4.127 u[IU]/mL (ref 0.350–4.500)

## 2024-06-07 LAB — URINALYSIS, W/ REFLEX TO CULTURE (INFECTION SUSPECTED)
Bacteria, UA: NONE SEEN
Bilirubin Urine: NEGATIVE
Glucose, UA: >=500 mg/dL — AB
Hgb urine dipstick: NEGATIVE
Ketones, ur: NEGATIVE mg/dL
Leukocytes,Ua: NEGATIVE
Nitrite: NEGATIVE
Protein, ur: NEGATIVE mg/dL
Specific Gravity, Urine: 1.028 (ref 1.005–1.030)
pH: 5 (ref 5.0–8.0)

## 2024-06-07 LAB — CBG MONITORING, ED
Glucose-Capillary: 261 mg/dL — ABNORMAL HIGH (ref 70–99)
Glucose-Capillary: 183 mg/dL — ABNORMAL HIGH (ref 70–99)

## 2024-06-07 LAB — COMPREHENSIVE METABOLIC PANEL WITH GFR
ALT: 5 U/L (ref 0–44)
AST: 12 U/L — ABNORMAL LOW (ref 15–41)
Albumin: 3.1 g/dL — ABNORMAL LOW (ref 3.5–5.0)
Alkaline Phosphatase: 64 U/L (ref 38–126)
Anion gap: 9 (ref 5–15)
BUN: 34 mg/dL — ABNORMAL HIGH (ref 8–23)
CO2: 22 mmol/L (ref 22–32)
Calcium: 8.5 mg/dL — ABNORMAL LOW (ref 8.9–10.3)
Chloride: 101 mmol/L (ref 98–111)
Creatinine, Ser: 1.56 mg/dL — ABNORMAL HIGH (ref 0.61–1.24)
GFR, Estimated: 49 mL/min — ABNORMAL LOW (ref >60)
Glucose, Bld: 209 mg/dL — ABNORMAL HIGH (ref 70–99)
Potassium: 5.1 mmol/L (ref 3.5–5.1)
Sodium: 132 mmol/L — ABNORMAL LOW (ref 135–145)
Total Bilirubin: 0.7 mg/dL (ref 0.0–1.2)
Total Protein: 5.5 g/dL — ABNORMAL LOW (ref 6.5–8.1)

## 2024-06-07 LAB — PROTIME-INR
INR: 1 (ref 0.8–1.2)
Prothrombin Time: 13.2 s (ref 11.4–15.2)

## 2024-06-07 LAB — ETHANOL: Alcohol, Ethyl (B): <15 mg/dL (ref <15)

## 2024-06-07 LAB — I-STAT CG4 LACTIC ACID, ED: Lactic Acid, Venous: 2.6 mmol/L (ref 0.5–1.9)

## 2024-06-07 LAB — HIV ANTIBODY (ROUTINE TESTING W REFLEX): HIV Screen 4th Generation wRfx: NONREACTIVE

## 2024-06-07 MED ORDER — PREGABALIN 100 MG PO CAPS
300.0000 mg | ORAL_CAPSULE | Freq: Two times a day (BID) | ORAL | Status: DC
  Administered 2024-06-07 – 2024-06-09 (×4): 300 mg via ORAL
  Filled 2024-06-07 (×4): qty 3

## 2024-06-07 MED ORDER — ACETAMINOPHEN 325 MG PO TABS
650.0000 mg | ORAL_TABLET | Freq: Four times a day (QID) | ORAL | Status: DC | PRN
Start: 2024-06-07 — End: 2024-06-09

## 2024-06-07 MED ORDER — FENTANYL 25 MCG/HR TD PT72
1.0000 | MEDICATED_PATCH | TRANSDERMAL | Status: DC
  Administered 2024-06-08: 1 via TRANSDERMAL
  Filled 2024-06-07: qty 1

## 2024-06-07 MED ORDER — NICOTINE 14 MG/24HR TD PT24
14.0000 mg | MEDICATED_PATCH | Freq: Every day | TRANSDERMAL | Status: DC
  Administered 2024-06-07 – 2024-06-09 (×3): 14 mg via TRANSDERMAL
  Filled 2024-06-07 (×3): qty 1

## 2024-06-07 MED ORDER — SODIUM CHLORIDE 0.9 % IV SOLN: INTRAVENOUS | Status: DC

## 2024-06-07 MED ORDER — LEVOTHYROXINE SODIUM 75 MCG PO TABS
75.0000 ug | ORAL_TABLET | Freq: Every day | ORAL | Status: DC
  Administered 2024-06-08 – 2024-06-09 (×2): 75 ug via ORAL
  Filled 2024-06-07 (×2): qty 1

## 2024-06-07 MED ORDER — POLYETHYLENE GLYCOL 3350 17 G PO PACK: 17.0000 g | PACK | Freq: Every day | ORAL | Status: DC | PRN

## 2024-06-07 MED ORDER — HYDROCHLOROTHIAZIDE 12.5 MG PO TABS
12.5000 mg | ORAL_TABLET | ORAL | Status: DC
  Filled 2024-06-07 (×2): qty 1

## 2024-06-07 MED ORDER — PANTOPRAZOLE SODIUM 40 MG PO TBEC
40.0000 mg | DELAYED_RELEASE_TABLET | Freq: Every day | ORAL | Status: DC
  Administered 2024-06-08 – 2024-06-09 (×2): 40 mg via ORAL
  Filled 2024-06-07 (×2): qty 1

## 2024-06-07 MED ORDER — LEVETIRACETAM 500 MG PO TABS
500.0000 mg | ORAL_TABLET | Freq: Two times a day (BID) | ORAL | Status: DC
  Administered 2024-06-07 – 2024-06-09 (×4): 500 mg via ORAL
  Filled 2024-06-07 (×4): qty 1

## 2024-06-07 MED ORDER — UMECLIDINIUM-VILANTEROL 62.5-25 MCG/ACT IN AEPB
1.0000 | INHALATION_SPRAY | Freq: Every day | RESPIRATORY_TRACT | Status: DC
  Administered 2024-06-08 – 2024-06-09 (×2): 1 via RESPIRATORY_TRACT
  Filled 2024-06-07: qty 14

## 2024-06-07 MED ORDER — CLOPIDOGREL BISULFATE 75 MG PO TABS
75.0000 mg | ORAL_TABLET | Freq: Every morning | ORAL | Status: DC
  Administered 2024-06-08 – 2024-06-09 (×2): 75 mg via ORAL
  Filled 2024-06-07 (×2): qty 1

## 2024-06-07 MED ORDER — IOHEXOL 350 MG/ML SOLN
75.0000 mL | Freq: Once | INTRAVENOUS | Status: AC | PRN
  Administered 2024-06-07: 75 mL via INTRAVENOUS

## 2024-06-07 MED ORDER — LISINOPRIL 10 MG PO TABS: 5.0000 mg | ORAL_TABLET | Freq: Every morning | ORAL | Status: DC

## 2024-06-07 MED ORDER — CARBIDOPA-LEVODOPA 25-250 MG PO TABS: 2.0000 | ORAL_TABLET | Freq: Two times a day (BID) | ORAL | Status: DC

## 2024-06-07 MED ORDER — ALBUTEROL SULFATE (2.5 MG/3ML) 0.083% IN NEBU: 3.0000 mL | INHALATION_SOLUTION | RESPIRATORY_TRACT | Status: DC | PRN

## 2024-06-07 MED ORDER — CITALOPRAM HYDROBROMIDE 10 MG PO TABS
20.0000 mg | ORAL_TABLET | Freq: Every morning | ORAL | Status: DC
  Administered 2024-06-08 – 2024-06-09 (×2): 20 mg via ORAL
  Filled 2024-06-07 (×2): qty 2

## 2024-06-07 MED ORDER — SODIUM CHLORIDE 0.9% FLUSH
3.0000 mL | Freq: Two times a day (BID) | INTRAVENOUS | Status: DC
  Administered 2024-06-07 – 2024-06-09 (×4): 3 mL via INTRAVENOUS

## 2024-06-07 MED ORDER — ROSUVASTATIN CALCIUM 20 MG PO TABS
20.0000 mg | ORAL_TABLET | Freq: Every morning | ORAL | Status: DC
  Administered 2024-06-08 – 2024-06-09 (×2): 20 mg via ORAL
  Filled 2024-06-07 (×2): qty 1

## 2024-06-07 MED ORDER — METOPROLOL TARTRATE 50 MG PO TABS
100.0000 mg | ORAL_TABLET | Freq: Two times a day (BID) | ORAL | Status: DC
  Administered 2024-06-07 – 2024-06-09 (×4): 100 mg via ORAL
  Filled 2024-06-07: qty 4
  Filled 2024-06-07 (×3): qty 2

## 2024-06-07 MED ORDER — LACTATED RINGERS IV BOLUS
1000.0000 mL | Freq: Once | INTRAVENOUS | Status: AC
  Administered 2024-06-07: 1000 mL via INTRAVENOUS

## 2024-06-07 MED ORDER — INSULIN GLARGINE-YFGN 100 UNIT/ML ~~LOC~~ SOLN
10.0000 [IU] | Freq: Every day | SUBCUTANEOUS | Status: DC
  Administered 2024-06-07 – 2024-06-08 (×2): 10 [IU] via SUBCUTANEOUS
  Filled 2024-06-07 (×3): qty 0.1

## 2024-06-07 MED ORDER — OXYCODONE HCL 5 MG PO TABS
15.0000 mg | ORAL_TABLET | Freq: Four times a day (QID) | ORAL | Status: DC | PRN
  Administered 2024-06-07 – 2024-06-09 (×6): 15 mg via ORAL
  Filled 2024-06-07 (×6): qty 3

## 2024-06-07 MED ORDER — INSULIN ASPART 100 UNIT/ML IJ SOLN
0.0000 [IU] | Freq: Three times a day (TID) | INTRAMUSCULAR | Status: DC
  Administered 2024-06-08: 5 [IU] via SUBCUTANEOUS
  Administered 2024-06-08 – 2024-06-09 (×2): 3 [IU] via SUBCUTANEOUS
  Administered 2024-06-09: 1 [IU] via SUBCUTANEOUS

## 2024-06-07 MED ORDER — CARBIDOPA-LEVODOPA ER 50-200 MG PO TBCR
1.0000 | EXTENDED_RELEASE_TABLET | Freq: Three times a day (TID) | ORAL | Status: DC
  Administered 2024-06-07: 1 via ORAL
  Filled 2024-06-07 (×3): qty 1

## 2024-06-07 MED ORDER — APIXABAN 5 MG PO TABS: 5.0000 mg | ORAL_TABLET | Freq: Two times a day (BID) | ORAL | Status: DC

## 2024-06-07 MED ORDER — SODIUM CHLORIDE 0.9% FLUSH
3.0000 mL | Freq: Once | INTRAVENOUS | Status: AC
  Administered 2024-06-07: 3 mL via INTRAVENOUS

## 2024-06-07 MED ORDER — ACETAMINOPHEN 650 MG RE SUPP: 650.0000 mg | Freq: Four times a day (QID) | RECTAL | Status: DC | PRN

## 2024-06-07 NOTE — ED Notes (Signed)
 Pt states something keeps jumping down from the roof maybe it is a Gorilla or something

## 2024-06-07 NOTE — Code Documentation (Signed)
 Stroke Response Nurse Documentation Code Documentation  Charles Huynh is a 63 y.o. male arriving to [facility]  via [facility] on 06/07/2024 with past medical hx of afib on eliquis , prior strokes, clotting disorder, DM, HTN, HLD, CAD, anxiety, depression, hx of PE, parkinsons, BPH. On Eliquis  (apixaban ) daily. Code stroke was activated by EMS.   Patient from home where he was LKW at 06/06/2024 prior to bed and now complaining of left arm weakness, left facial droop, and denial of the left arm. Per EMS, patient was last seen normal before bedtime last night, his wife found him this morning at 0300 pulling the blanket up to his neck and staring off/spaced out, not himself. En route, EMS said he told them he was seeing figures and having left arm denial.  Stroke team at the bedside on patient arrival. Labs drawn and patient cleared for CT by EDP. Patient to CT with team. NIHSS 9, see documentation for details and code stroke times. Patient with decreased LOC, disoriented, left facial droop, bilateral arm weakness, bilateral leg weakness, and dysarthria  on exam. The following imaging was completed:  CT Head, CTA, and CTP. Patient is not a candidate for IV Thrombolytic due to patient outside of window per MD. Patient is not not a candidate for IR due to no LVO noted on imaging.   Care Plan: VA/NIHSS q2hr x12hr, then q4hr; BP Goal <220/120.   Bedside handoff with ED RN Alan.    Annabella DELENA Bame  Stroke Response RN

## 2024-06-07 NOTE — ED Triage Notes (Signed)
 Pt comming in from home where he woke up at 0300 acting strange per wife. Pt son came in the morning and noticed a facial droop . Pt had a gcs of 125 initially for ems but declined top 11 during drive to hospital. Pt reported to ems that he was seeing figures . Pt family reports that his tremors are worse today.  Ems vitals 110/60 pulse of 70 and 95% on 3l

## 2024-06-07 NOTE — H&P (Addendum)
 History and Physical   Charles Huynh FMW:[id number] DOB: 1961-03-31 DOA: 06/07/2024  PCP: Arminda Josphine FALCON, PA-C   Patient coming from: Home  Chief Complaint: Weakness  HPI: Charles Huynh is a 63 y.o. male with medical history significant of hypertension, hyperlipidemia, CVA, hypothyroidism, paroxysmal A-fib, diabetes, GERD, CAD, BPH, depression, anxiety, Parkinson's disease, seizures, chronic bronchitis, PE, chronic pain status post spinal stimulator presenting with weakness.  History obtained with assistance of chart review and family.  Patient's last known normal was around 3 AM.  Patient has had 2 to 3 days of weakness and feeling generally unwell.  EMS was called out for weakness per reports and they noted left-sided facial droop and left greater than right sided weakness initially.  Patient reportedly came less responsive and route.  Initially had some hypotension which resolved with fluids in the ED.  In the ED, patient was noted to be alert and oriented to self only which is not his baseline per family.  Patient denies fevers, chills, chest pain, shortness of breath, abdominal pain, constipation, diarrhea, nausea, vomiting.  ED Course: Vital signs in the ED notable for blood pressure initially reading in the 70s-90 systolic but improved to the 100s-130 systolic after 1 L IV fluids.  Lab workup included CMP with sodium 132, BUN 34, creatinine elevated to 1.56 and baseline of 1, glucose 209, calcium  8.5, protein 5.5, albumin 3.1.  CBC with leukocytosis to 12.3.  PT, PTT, INR normal.  Lactic acid trend normal, 2.6, repeat pending.  Ethanol level negative.  Urinalysis, UDS, blood culture pending.   Chest x-ray showed no acute abnormality but did show a pulse generator in the abdomen.  CT head showed no acute abnormality.  CTA head and neck with perfusion showed mild right carotid atherosclerosis but otherwise no acute abnormality.  MRI brain ordered but unclear if this will be able to be done due  to spinal stimulator.    Patient received 1 L IV fluids.  Neurology consulted recommended MRI and further workup if positive if the MRI is able to be done.  Otherwise given patient's confusion and more generalized weakness recommend infectious workup and further encephalopathy workup as indicated.  Review of Systems: As per HPI otherwise all other systems reviewed and are negative.  Past Medical History:  Diagnosis Date   Acute bronchitis 06/07/2024   Acute encephalopathy 03/13/2022   Anxiety    Atrial fibrillation (HCC)    Barrett esophagus    Benign prostatic hyperplasia with urinary obstruction 12/28/2015   Cerebrovascular accident (HCC) 07/09/2017   Chronic pain of both shoulders 04/08/2021   Chronic pain syndrome 07/09/2017   Clotting disorder (HCC)    Coronary artery disease of native artery of native heart with stable angina pectoris (HCC) 01/03/2020   Decreased testosterone level 07/09/2017   Depression    Diabetes (HCC)    Dyslipidemia 03/11/2021   Dysphagia 03/11/2021   Dyspnea 03/11/2021   Essential hypertension 08/30/2019   Fatigue 03/11/2021   Gastroesophageal reflux disease without esophagitis 09/02/2017   Gastroparesis 01/24/2021   Heart disease    History of ulcer disease    Hx pulmonary embolism    Hyperlipidemia    Hypertension    Hypogonadism in male 03/11/2021   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 02/06/2021   Hypothyroidism 07/09/2017   Intermittent palpitations 03/11/2021   Mild episode of recurrent major depressive disorder (HCC) 01/24/2021   Mixed hyperlipidemia 01/03/2020   Nausea 03/11/2021   Nicotine dependence 03/11/2021   PAF (paroxysmal atrial fibrillation) (HCC)  01/03/2020   Parkinson's disease (HCC)    Seizure (HCC) 03/13/2022   Shoulder pain 03/11/2021   Smoker 03/11/2021   Spasm of back muscles 03/11/2021   Stroke [facility])    Thyroid disease    Type 2 diabetes mellitus without complication (HCC) 07/09/2017   Wound of skin 03/11/2021    Past  Surgical History:  Procedure Laterality Date   BACK SURGERY     CARDIAC CATHETERIZATION     COLONOSCOPY  2019   have a small polyp [place] [address] [facility]   CORONARY PRESSURE/FFR STUDY N/A 01/22/2022   Procedure: INTRAVASCULAR PRESSURE WIRE/FFR STUDY;  Surgeon: Anner Alm ORN, MD;  Location: [facility] INVASIVE CV LAB;  Service: Cardiovascular;  Laterality: N/A;   ESOPHAGOGASTRODUODENOSCOPY  2019   Dr Sizemore. [place]   KNEE ARTHROSCOPY Left    LEFT HEART CATH AND CORONARY ANGIOGRAPHY N/A 01/22/2022   Procedure: LEFT HEART CATH AND CORONARY ANGIOGRAPHY;  Surgeon: Anner Alm ORN, MD;  Location: [facility] INVASIVE CV LAB;  Service: Cardiovascular;  Laterality: N/A;   NECK SURGERY     SPINAL CORD STIMULATOR INSERTION     WRIST SURGERY Bilateral     Social History  reports that he has been smoking cigarettes. He has a 47 pack-year smoking history. He has never used smokeless tobacco. He reports that he does not currently use alcohol. He reports that he does not use drugs.  Allergies  Allergen Reactions   Naproxen Sodium Shortness Of Breath and Anaphylaxis   Betadine [Povidone Iodine] Rash    Family History  Problem Relation Age of Onset   Lung cancer Mother    Heart attack Father        At age 67.   Emphysema Father    Heart disease Father    Colon cancer Neg Hx    Esophageal cancer Neg Hx    Stomach cancer Neg Hx    Rectal cancer Neg Hx   Reviewed on admission  Prior to Admission medications   Medication Sig Start Date End Date Taking? Authorizing Provider  albuterol (VENTOLIN HFA) 108 (90 Base) MCG/ACT inhaler Inhale 2 puffs into the lungs every 4 (four) hours as needed for wheezing or shortness of breath. 12/19/19   [provider]  baclofen (LIORESAL) 10 MG tablet Take 10 mg by mouth 3 (three) times daily as needed for muscle spasms. 07/07/19   [provider]  bismuth subsalicylate (PEPTO BISMOL) 262 MG/15ML suspension Take 30 mLs by mouth every 6 (six) hours  as needed for indigestion.    [provider]  carbidopa-levodopa (SINEMET IR) 25-250 MG tablet Take 2 tablets by mouth 2 (two) times daily.    [provider]  citalopram (CELEXA) 20 MG tablet Take 20 mg by mouth daily.    [provider]  D3-50 1.25 MG (50000 UT) capsule Take 50,000 Units by mouth every Sunday. 01/06/22   [provider]  DILT-XR 120 MG 24 hr capsule Take 120 mg by mouth daily. 08/25/19   [provider]  diphenhydrAMINE  (BENADRYL ) 25 MG tablet Take 25 mg by mouth daily as needed for allergies.    [provider]  ELIQUIS  5 MG TABS tablet Take 1 tablet (5 mg total) by mouth 2 (two) times daily. 10/26/20   Tobb, Kardie, DO  esomeprazole (NEXIUM) 40 MG capsule Take 40 mg by mouth daily.    [provider]  fentaNYL  (DURAGESIC ) 25 MCG/HR Place 1 patch onto the skin as directed. Apply every 2.5 days 01/10/21  [provider]  hydrochlorothiazide  (MICROZIDE ) 12.5 MG capsule Take 1 capsule (12.5 mg total) by mouth daily. Patient taking differently: Take 12.5 mg by mouth every Monday, Wednesday, and Friday. 10/26/20   Tobb, Kardie, DO  hydrocortisone cream 1 % Apply 1 application topically 2 (two) times daily as needed for itching.    [provider]  isosorbide  mononitrate (IMDUR ) 60 MG 24 hr tablet TAKE 1 TABLET(60 MG) BY MOUTH DAILY 12/09/22   Tobb, Kardie, DO  JANUVIA 100 MG tablet Take 100 mg by mouth daily. 08/08/19   [provider]  JARDIANCE 25 MG TABS tablet Take 25 mg by mouth daily. 03/20/20   [provider]  levothyroxine (SYNTHROID) 75 MCG tablet Take 75 mcg by mouth daily. 01/09/22   [provider]  Menthol, Topical Analgesic, (BIOFREEZE EX) Apply 1 application topically daily as needed (shoulder pain).    [provider]  metFORMIN (GLUCOPHAGE-XR) 500 MG 24 hr tablet Take 1,000 mg by mouth daily. 07/17/20   [provider]  metoCLOPramide  (REGLAN ) 10 MG  tablet Take 10 mg by mouth 2 (two) times daily. 08/18/19   [provider]  metoprolol  tartrate (LOPRESSOR ) 100 MG tablet Take 1 tablet (100 mg total) by mouth 2 (two) times daily. 10/26/20   Tobb, Kardie, DO  Naldemedine Tosylate (SYMPROIC) 0.2 MG TABS Take 0.2 mg by mouth daily as needed (constipation).    [provider]  Naphazoline HCl (CLEAR EYES OP) Place 1 drop into both eyes daily as needed (itchy eyes).    [provider]  nitroGLYCERIN  (NITROSTAT ) 0.4 MG SL tablet DISSOLVE 1 TABLET UNDER THE TONGUE EVERY 5 MINUTES AS NEEDED FOR CHEST PAIN 01/21/23   Tobb, Kardie, DO  NUPLAZID 34 MG CAPS Take 1 capsule by mouth daily. 09/12/20   [provider]  OVER THE COUNTER MEDICATION Place 1 spray in ear(s) daily as needed (itchy ears).    [provider]  oxyCODONE (ROXICODONE) 15 MG immediate release tablet Take 15 mg by mouth 5 (five) times daily as needed for pain. 03/27/21   [provider]  potassium chloride  (KLOR-CON ) 10 MEQ tablet Take 1 tablet by mouth daily 10/26/20   Tobb, Kardie, DO  pregabalin (LYRICA) 300 MG capsule Take 300 mg by mouth 2 (two) times daily. 07/28/19   [provider]  promethazine (PHENERGAN) 25 MG tablet Take 25 mg by mouth every 6 (six) hours as needed for nausea or vomiting.    [provider]  ranolazine  (RANEXA ) 1000 MG SR tablet Take 1 tablet (1,000 mg total) by mouth 2 (two) times daily. 02/27/22   Tobb, Kardie, DO  rosuvastatin  (CRESTOR ) 20 MG tablet Take 1 tablet (20 mg total) by mouth daily. 01/14/22 05/19/22  Tobb, Kardie, DO  testosterone cypionate (DEPOTESTOSTERONE CYPIONATE) 200 MG/ML injection Inject 200 mg into the muscle every 14 (fourteen) days. 03/13/20   [provider]  TRESIBA FLEXTOUCH 100 UNIT/ML FlexTouch Pen Inject 22 Units into the skin at bedtime. 03/22/21   [provider]    Physical Exam: Vitals:   06/07/24 1329 06/07/24 1330 06/07/24 1345 06/07/24 1415  BP:   (!) 110/95 (!) 137/109 103/85  Pulse:  76 63 61  Resp:  (!) 9 13 18   Temp: 98.2 F (36.8 C)     TempSrc: Oral     SpO2:  97% 95% 98%  Weight:      Height:  5' 10 (1.778 m)      Physical Exam Constitutional:  General: He is not in acute distress.    Appearance: Normal appearance.  HENT:     Head: Normocephalic and atraumatic.     Mouth/Throat:     Mouth: Mucous membranes are moist.     Pharynx: Oropharynx is clear.   Eyes:     Extraocular Movements: Extraocular movements intact.     Pupils: Pupils are equal, round, and reactive to light.    Cardiovascular:     Rate and Rhythm: Normal rate and regular rhythm.     Pulses: Normal pulses.     Heart sounds: Normal heart sounds.  Pulmonary:     Effort: Pulmonary effort is normal. No respiratory distress.     Breath sounds: Normal breath sounds.  Abdominal:     General: Bowel sounds are normal. There is no distension.     Palpations: Abdomen is soft.     Tenderness: There is no abdominal tenderness.   Musculoskeletal:        General: No swelling or deformity.   Skin:    General: Skin is warm and dry.   Neurological:     Comments: Patient is awake, alert, oriented to self only Motor: Good effort thorughout, at Least 5/5 bilateral UE, 5/5 bilateral lower extremitiy, tremor noted  Sensory: Sensation is grossly intact bilateral UEs & LEs   Labs on Admission: I have personally reviewed following labs and imaging studies  CBC: Recent Labs  Lab 06/07/24 1250 06/07/24 1259  WBC 12.3*  --   NEUTROABS 8.8*  --   HGB 16.2 16.3  HCT 48.3 48.0  MCV 86.3  --   PLT 239  --     Basic Metabolic Panel: Recent Labs  Lab 06/07/24 1259 06/07/24 1350  NA 133* 132*  K 6.8* 5.1  CL 101 101  CO2  --  22  GLUCOSE 216* 209*  BUN 54* 34*  CREATININE 1.60* 1.56*  CALCIUM   --  8.5*    GFR: Estimated Creatinine Clearance: 49.4 mL/min (A) (by C-G formula based on SCr of 1.56 mg/dL (H)).  Liver Function Tests: Recent  Labs  Lab 06/07/24 1350  AST 12*  ALT 5  ALKPHOS 64  BILITOT 0.7  PROT 5.5*  ALBUMIN 3.1*    Urine analysis: No results found for: COLORURINE, APPEARANCEUR, LABSPEC, PHURINE, GLUCOSEU, HGBUR, BILIRUBINUR, KETONESUR, PROTEINUR, UROBILINOGEN, NITRITE, LEUKOCYTESUR  Radiological Exams on Admission: DG CHEST PORT 1 VIEW Result Date: 06/07/2024 CLINICAL DATA:  Altered mental status, facial droop EXAM: PORTABLE CHEST 1 VIEW COMPARISON:  03/18/2024 FINDINGS: Stable appearance of cervical plate and screw fixator and left upper abdominal pulse generator with lead electrodes projecting over the midline lower neck. The lungs appear clear. Cardiac and mediastinal contours normal. No blunting of the costophrenic angles. No significant bony findings. IMPRESSION: 1. No acute findings. 2. Cervical plate and screw fixator and left upper abdominal pulse generator with lead electrodes projecting over the midline lower neck. Electronically Signed   By: Ryan Salvage M.D.   On: 06/07/2024 14:38   CT ANGIO HEAD NECK W WO CM W PERF (CODE STROKE) Result Date: 06/07/2024 CLINICAL DATA:  Acute neurological deficit. EXAM: CT HEAD WITHOUT CONTRAST CT ANGIOGRAPHY OF THE HEAD AND NECK CT PERFUSION BRAIN TECHNIQUE: Contiguous axial images were obtained from the base of the skull through the vertex without intravenous contrast. Multidetector CT imaging of the head and neck was performed using the standard protocol during bolus administration of intravenous contrast. Multiplanar CT image reconstructions and MIPs were obtained to evaluate the  vascular anatomy. Carotid stenosis measurements (when applicable) are obtained utilizing NASCET criteria, using the distal internal carotid diameter as the denominator. Multiphase CT imaging of the brain was performed following IV bolus contrast injection. Subsequent parametric perfusion maps were calculated using RAPID software. RADIATION DOSE REDUCTION: This  exam was performed according to the departmental dose-optimization program which includes automated exposure control, adjustment of the mA and/or kV according to patient size and/or use of iterative reconstruction technique. CONTRAST:  75mL OMNIPAQUE  IOHEXOL  350 MG/ML SOLN COMPARISON:  CT of the head dated January 23, 2022. FINDINGS: CT HEAD Brain: Cerebral atrophy and mild to moderate cerebral white matter disease. Vascular: No acute process. Skull: Intact. Sinuses/Orbits: Unremarkable. CTA NECK Aortic arch: Mild calcific atheromatous disease. No evidence of aneurysm or stenosis. Right carotid system: Mild to moderate calcific plaque within the carotid bulb and origin of the internal carotid artery. Mild stenosis of the proximal cervical segment of the internal carotid artery, proximally 20%. Left carotid system: Mild calcific plaque at the origin of the internal carotid artery, which is tortuous, but normal in caliber. Mild atheromatous disease within the common carotid artery without significant stenosis. Vertebral arteries:Left vertebral artery is dominant. Both arteries are normal in caliber throughout the respective courses. Skeleton: Status post ACDF at C5-6. Posterior spinal cord stimulator device is present. Other neck: None. CTA HEAD Anterior circulation: Normal. No evidence of aneurysm, large vessel occlusion or flow-limiting stenosis. Posterior circulation: Normal. Fetal type origin of the right posterior cerebral artery with diminutive right P1 segment. Venous sinuses: Patent. Anatomic variants: As above. Delayed phase: No abnormal parenchymal or meningeal enhancement. CT Brain Perfusion Findings: ASPECTS: 10. CBF (<30%) Volume: 0mL Perfusion (Tmax>6.0s) volume: 0mL Mismatch Volume: 0mL Infarction Location:Not applicable. IMPRESSION: 1. Mild calcific atheromatous disease within the right internal carotid artery with mild stenosis of the lower cervical segment, proximally 20%. 2. Negative CT angiogram of  the head. 3. Normal CT perfusion study. These results were communicated at the time of interpretation on 06/07/2024 at 1:30 pm to Dr. ELIGIO LAV . Electronically Signed   By: Evalene Coho M.D.   On: 06/07/2024 13:38   CT HEAD CODE STROKE WO CONTRAST Result Date: 06/07/2024 CLINICAL DATA:  Code stroke. EXAM: CT HEAD WITHOUT CONTRAST TECHNIQUE: Contiguous axial images were obtained from the base of the skull through the vertex without intravenous contrast. RADIATION DOSE REDUCTION: This exam was performed according to the departmental dose-optimization program which includes automated exposure control, adjustment of the mA and/or kV according to patient size and/or use of iterative reconstruction technique. COMPARISON:  CT of the head dated June 24, 2023. FINDINGS: Brain: Moderate generalized cerebral and cerebellar volume loss and moderate periventricular and deep cerebral white matter disease. No evidence of hemorrhage, mass, acute cortical infarct or hydrocephalus. Vascular: No hyperdense vessel. Moderate calcific atheromatous disease within the carotid siphons. Skull: Intact and unremarkable. Sinuses/Orbits: Minimal mucosal disease within the right maxillary sinus. The orbits are unremarkable. The mastoid air cells are clear. Other: None. ASPECTS [facility] Stroke Program Early CT Score) - Ganglionic level infarction (caudate, lentiform nuclei, internal capsule, insula, M1-M3 cortex): 7. - Supraganglionic infarction (M4-M6 cortex): 3. Total score (0-10 with 10 being normal): 10. IMPRESSION: 1. Age-related atrophy and mild to moderate cerebral white matter disease. 2. ASPECTS is 10. 3. These results were called by telephone at the time of interpretation on 06/07/2024 at 1:06 pm to provider Dr. LAV, who verbally acknowledged these results. Electronically Signed   By: Evalene Coho M.D.   On:  06/07/2024 13:18   EKG: Independently reviewed.  Sinus rhythm at 76 bpm.  Nonspecific T wave changes.  Low  voltage multiple leads.  Left bundle branch block.  Similar to previous.  Assessment/Plan Active Problems:   Essential hypertension   PAF (paroxysmal atrial fibrillation) (HCC)   Hypothyroidism   Anxiety   Type 2 diabetes mellitus without complication (HCC)   Hx pulmonary embolism   Hyperlipidemia   Hypertension   Parkinson's disease (HCC)   Chronic pain syndrome   Gastroesophageal reflux disease without esophagitis   History of CVA (cerebrovascular accident)   Benign prostatic hyperplasia with urinary obstruction   Mild episode of recurrent major depressive disorder (HCC)   Coronary artery disease of native artery of native heart with stable angina pectoris (HCC)   Complex partial seizure disorder (HCC)   Altered mental status Weakness Rule out stroke Rule out infection > Patient presented with 2 to 3 days of weakness.  EMS concern for left facial droop and left greater than right weakness.  Decreased responsiveness and route improved with IV fluids. > Found to have generalized weakness in the ED with confusion and patient alert and oriented to self only. > Found to have leukocytosis to 12.3 unclear if this is representing infection versus reactive leukocytosis in the setting of dehydration as below. > CT head and CT angiogram head and neck with perfusion were negative for acute abnormality. > Leukocytosis to 12.3 normal chest x-ray, pending urinalysis and UDS and blood culture. > Neurology consulted recommended MRI brain to further rule out stroke if able given patient's spinal stimulator and infection workup.  Patient has some mention of seizure disorder and has history but this appears to been suspicion and never confirmed based on initial chart review. - Monitor on telemetry overnight - Appreciate neurology recommendations and assistance - MRI brain if able, if unable may need repeat CT head - Complete infectious workup with urinalysis, UDS, blood cultures - No antibiotics at  this time - Consider EEG if other workup negative and symptoms persist.  AKI Transient hypotension Elevated lactic acid > Patient with creatinine elevated to 1.56 and baseline of 1.  Some transient hypotension on arrival which improved with IV fluids.  Initial lactic acid normal but repeat 2.6.   > Received 1 L IV fluids in the ED. - Continue IV fluids - Trend renal function and electrolytes - Trend lactic acid  Hypertension - Continue home hydrochlorothiazide , metoprolol  - Diltiazem and Imdur  have been discontinued per med rec  Hyperlipidemia - Continue home rosuvastatin   History of CVA - Continue home Eliquis , rosuvastatin   History of seizure - Continue home Keppra  Hypothyroidism > Patient thinks he was taken off Synthroid in the past, but [facility] discharge note from 2 months ago indicates he was to resume it. - Check TSH - Continue/resume Synthroid for now  Paroxysmal atrial fibrillation > Patient thinks Eliquis  was to be held but his cardiology follow-up at [facility] on 5/1 indicated he was to continue on Eliquis  in addition to Plavix - Continue home metoprolol  - Hold Eliquis  until repeat neuroimaging confirms no bleeding - Will need to resume Eliquis  on discharge unless there is a contraindication  Diabetes > 20 units nightly at home. - 10 units long-acting insulin at bedtime - SSI  GERD - Continue home PPI  CAD > Recent stenting in March at [facility] with plan for 12 months of Plavix in addition to Eliquis . (As above, patient thought he was supposed to stop Eliquis ) - Metoprolol , Ranexa , rosuvastatin  -  Continue Plavix - Imdur  discontinued at recent cardiology visit - Hold Eliquis  until repeat neuroimaging confirms no bleeding  Emphysema - Continue Anoro Ellipta - As needed albuterol  Depression Anxiety - Continue home Celexa  Parkinson's disease - Continue home carbidopa-levodopa - Reports is no longer taking Nuplazid  History of PE - Confusion about  Eliquis  as above - Holding for now as above  Chronic pain > Status post spinal stimulator - Continue home pain medication  DVT prophylaxis: Eliquis  Code Status:   Full Family Communication:  Updated at bedside  Disposition Plan:   Patient is from:  Home  Anticipated DC to:  Home  Anticipated DC date:  1 to 3 days  Anticipated DC barriers: None  Consults called:  Neurology Admission status:  Observation, telemetry  Severity of Illness: The appropriate patient status for this patient is OBSERVATION. Observation status is judged to be reasonable and necessary in order to provide the required intensity of service to ensure the patient's safety. The patient's presenting symptoms, physical exam findings, and initial radiographic and laboratory data in the context of their medical condition is felt to place them at decreased risk for further clinical deterioration. Furthermore, it is anticipated that the patient will be medically stable for discharge from the hospital within 2 midnights of admission.    Marsa KATHEE Scurry MD [facility]  How to contact the [facility] Attending or Consulting provider 7A - 7P or covering provider during after hours 7P -7A, for this patient?   Check the care team in [facility] and look for a) attending/consulting [facility] provider listed and b) the [facility] team listed Log into [email or web address] and use [facility] universal password to access. If you do not have the password, please contact the hospital operator. Locate the [facility] provider you are looking for under [facility] and page to a number that you can be directly reached. If you still have difficulty reaching the provider, please page the [facility] (Director on Call) for the Hospitalists listed on amion for assistance.  06/07/2024, 4:06 PM

## 2024-06-07 NOTE — Consult Note (Signed)
 NEUROLOGY CONSULT NOTE   Date of service: June 07, 2024 Patient Name: [facility] MRN:  [id number] DOB:  06/20/1961 Chief Complaint: Code stroke  Requesting Provider: Seena Marsa NOVAK, MD  History of Present Illness  [facility] is a 63 y.o. male with hx of a fib on Eliquis  (does not seem to have been taking it), prior strokes, seizure history on Keppra, clotting disorder, DM, HTN, HLD, CAD, anxiety and depression, GERD, HX of PE, hypothyroidism, dysphagia, parkinsons disease, BPH who presents to [facility] ED via EMS as a code stroke. EMS reports patient was LKW 0300 however on further review of history patient was not acting himself at that time and LKW changed to last night prior to bed. Patient was noted to have a left facial droop and have left side weakness. EMS reports he became less responsive en route to [facility].  On exam at the bridge he was lethargic, oriented to self, age stated 59 and month  January. He has generalized weakness in all 4 extremities, left facial droop and mild dysarthria. He does have tremors noted that he states is worse than normal  CT head with no acute process. CTA head and neck with no LVO.   LKW: last night prior to bed.  Modified rankin score: 2-Slight disability-UNABLE to perform all activities but does not need assistance IV Thrombolysis: No outside window EVT:  No LVO   NIHSS components Score: Comment  1a Level of Conscious 0[]  1[x]  2[]  3[]      1b LOC Questions 0[]  1[]  2[x]       1c LOC Commands 0[]  1[]  2[]       2 Best Gaze 0[]  1[]  2[]       3 Visual 0[]  1[]  2[]  3[]      4 Facial Palsy 0[]  1[x]  2[]  3[]      5a Motor Arm - left 0[]  1[x]  2[]  3[]  4[]  UN[]    5b Motor Arm - Right 0[]  1[x]  2[]  3[]  4[]  UN[]    6a Motor Leg - Left 0[]  1[x]  2[]  3[]  4[]  UN[]    6b Motor Leg - Right 0[]  1[x]  2[]  3[]  4[]  UN[]    7 Limb Ataxia 0[]  1[]  2[]  UN[]      8 Sensory 0[]  1[]  2[]  UN[]      9 Best Language 0[]  1[]  2[]  3[]      10 Dysarthria 0[]  1[x]  2[]  UN[]      11  Extinct. and Inattention 0[]  1[]  2[]       TOTAL: 9      ROS   Comprehensive ROSUnable to ascertain due to AMS  Past History   Past Medical History:  Diagnosis Date   Acute bronchitis 06/07/2024   Acute encephalopathy 03/13/2022   Anxiety    Atrial fibrillation (HCC)    Barrett esophagus    Benign prostatic hyperplasia with urinary obstruction 12/28/2015   Cerebrovascular accident (HCC) 07/09/2017   Chronic pain of both shoulders 04/08/2021   Chronic pain syndrome 07/09/2017   Clotting disorder (HCC)    Coronary artery disease of native artery of native heart with stable angina pectoris (HCC) 01/03/2020   Decreased testosterone level 07/09/2017   Depression    Diabetes (HCC)    Dyslipidemia 03/11/2021   Dysphagia 03/11/2021   Dyspnea 03/11/2021   Essential hypertension 08/30/2019   Fatigue 03/11/2021   Gastroesophageal reflux disease without esophagitis 09/02/2017   Gastroparesis 01/24/2021   Heart disease    History of ulcer disease    Hx pulmonary embolism    Hyperlipidemia    Hypertension  Hypogonadism in male 03/11/2021   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 02/06/2021   Hypothyroidism 07/09/2017   Intermittent palpitations 03/11/2021   Mild episode of recurrent major depressive disorder (HCC) 01/24/2021   Mixed hyperlipidemia 01/03/2020   Nausea 03/11/2021   Nicotine dependence 03/11/2021   PAF (paroxysmal atrial fibrillation) (HCC) 01/03/2020   Parkinson's disease (HCC)    Seizure (HCC) 03/13/2022   Shoulder pain 03/11/2021   Smoker 03/11/2021   Spasm of back muscles 03/11/2021   Stroke [facility])    Thyroid disease    Type 2 diabetes mellitus without complication (HCC) 07/09/2017   Wound of skin 03/11/2021    Past Surgical History:  Procedure Laterality Date   BACK SURGERY     CARDIAC CATHETERIZATION     COLONOSCOPY  2019   have a small polyp [place] Sanda   CORONARY PRESSURE/FFR STUDY N/A 01/22/2022   Procedure: INTRAVASCULAR PRESSURE WIRE/FFR  STUDY;  Surgeon: Anner Alm ORN, MD;  Location: [facility] INVASIVE CV LAB;  Service: Cardiovascular;  Laterality: N/A;   ESOPHAGOGASTRODUODENOSCOPY  2019   Dr Sizemore. [place]   KNEE ARTHROSCOPY Left    LEFT HEART CATH AND CORONARY ANGIOGRAPHY N/A 01/22/2022   Procedure: LEFT HEART CATH AND CORONARY ANGIOGRAPHY;  Surgeon: Anner Alm ORN, MD;  Location: [facility];  Service: Cardiovascular;  Laterality: N/A;   NECK SURGERY     SPINAL CORD STIMULATOR INSERTION     WRIST SURGERY Bilateral     Family History: Family History  Problem Relation Age of Onset   Lung cancer Mother    Heart attack Father        At age 78.   Emphysema Father    Heart disease Father    Colon cancer Neg Hx    Esophageal cancer Neg Hx    Stomach cancer Neg Hx    Rectal cancer Neg Hx     Social History  reports that he has been smoking cigarettes. He has a 47 pack-year smoking history. He has never used smokeless tobacco. He reports that he does not currently use alcohol. He reports that he does not use drugs.  Allergies  Allergen Reactions   Naproxen Sodium Shortness Of Breath and Anaphylaxis   Betadine [Povidone Iodine] Rash    Medications   Current Facility-Administered Medications:    0.9 %  sodium chloride  infusion, , Intravenous, Continuous, Melvin, Alexander B, MD, Last Rate: 125 mL/hr at 06/07/24 1640, New Bag at 06/07/24 1640   acetaminophen  (TYLENOL ) tablet 650 mg, 650 mg, Oral, Q6H PRN **OR** acetaminophen  (TYLENOL ) suppository 650 mg, 650 mg, Rectal, Q6H PRN, Melvin, Alexander B, MD   albuterol (PROVENTIL) (2.5 MG/3ML) 0.083% nebulizer solution 3 mL, 3 mL, Inhalation, Q4H PRN, Melvin, Alexander B, MD   carbidopa-levodopa (SINEMET CR) 50-200 MG per tablet controlled release 1 tablet, 1 tablet, Oral, TID, Melvin, Alexander B, MD   [START ON 06/08/2024] citalopram (CELEXA) tablet 20 mg, 20 mg, Oral, q AM, Melvin, Alexander B, MD   [START ON 06/08/2024] clopidogrel (PLAVIX) tablet 75 mg,  75 mg, Oral, q AM, Melvin, Alexander B, MD   [START ON 06/08/2024] fentaNYL  (DURAGESIC ) 25 MCG/HR 1 patch, 1 patch, Transdermal, UD, Melvin, Alexander B, MD   [START ON 06/08/2024] hydrochlorothiazide  (MICROZIDE ) capsule 12.5 mg, 12.5 mg, Oral, Q M,W,F, Melvin, Alexander B, MD   [START ON 06/08/2024] insulin aspart (novoLOG) injection 0-9 Units, 0-9 Units, Subcutaneous, TID WC, Melvin, Alexander B, MD   insulin glargine-yfgn [facility]) injection 10 Units, 10 Units, Subcutaneous, QHS, Melvin,  Marsa NOVAK, MD   levETIRAcetam (KEPPRA) tablet 500 mg, 500 mg, Oral, BID, Melvin, Alexander B, MD   [START ON 06/08/2024] levothyroxine (SYNTHROID) tablet 75 mcg, 75 mcg, Oral, Q0600, Melvin, Alexander B, MD   [START ON 06/08/2024] lisinopril (ZESTRIL) tablet 5 mg, 5 mg, Oral, q AM, Melvin, Alexander B, MD   metoprolol  tartrate (LOPRESSOR ) tablet 100 mg, 100 mg, Oral, BID, Melvin, Alexander B, MD   oxyCODONE (Oxy IR/ROXICODONE) immediate release tablet 15 mg, 15 mg, Oral, Q6H PRN, Melvin, Alexander B, MD   [START ON 06/08/2024] pantoprazole (PROTONIX) EC tablet 40 mg, 40 mg, Oral, Daily, Melvin, Alexander B, MD   polyethylene glycol (MIRALAX / GLYCOLAX) packet 17 g, 17 g, Oral, Daily PRN, Melvin, Alexander B, MD   pregabalin (LYRICA) capsule 300 mg, 300 mg, Oral, BID, Melvin, Alexander B, MD   [START ON 06/08/2024] rosuvastatin  (CRESTOR ) tablet 20 mg, 20 mg, Oral, q AM, Melvin, Alexander B, MD   sodium chloride  flush (NS) 0.9 % injection 3 mL, 3 mL, Intravenous, Q12H, Melvin, Alexander B, MD   [START ON 06/08/2024] umeclidinium-vilanterol (ANORO ELLIPTA) 62.5-25 MCG/ACT 1 puff, 1 puff, Inhalation, Daily, Melvin, Alexander B, MD  Current Outpatient Medications:    albuterol (VENTOLIN HFA) 108 (90 Base) MCG/ACT inhaler, Inhale 2 puffs into the lungs every 4 (four) hours as needed for wheezing or shortness of breath., Disp: , Rfl:    ANORO ELLIPTA 62.5-25 MCG/ACT AEPB, Inhale 1 puff into the lungs daily., Disp: , Rfl:     bismuth subsalicylate (PEPTO BISMOL) 262 MG/15ML suspension, Take 30 mLs by mouth every 6 (six) hours as needed for indigestion., Disp: , Rfl:    carbidopa-levodopa (SINEMET CR) 50-200 MG tablet, Take 1 tablet by mouth in the morning, at noon, in the evening, and at bedtime., Disp: , Rfl:    citalopram (CELEXA) 20 MG tablet, Take 20 mg by mouth in the morning., Disp: , Rfl:    clopidogrel (PLAVIX) 75 MG tablet, Take 75 mg by mouth in the morning., Disp: , Rfl:    diphenhydrAMINE  (BENADRYL ) 25 MG tablet, Take 25 mg by mouth daily as needed for allergies., Disp: , Rfl:    esomeprazole (NEXIUM) 40 MG capsule, Take 40 mg by mouth at bedtime., Disp: , Rfl:    fentaNYL  (DURAGESIC ) 25 MCG/HR, Place 1 patch onto the skin as directed. Apply every 2.5 days, Disp: , Rfl:    hydrochlorothiazide  (MICROZIDE ) 12.5 MG capsule, Take 1 capsule (12.5 mg total) by mouth daily. (Patient taking differently: Take 12.5 mg by mouth every Monday, Wednesday, and Friday.), Disp: 90 capsule, Rfl: 3   hydrocortisone cream 1 %, Apply 1 application topically 2 (two) times daily as needed for itching., Disp: , Rfl:    JARDIANCE 25 MG TABS tablet, Take 25 mg by mouth in the morning., Disp: , Rfl:    levETIRAcetam (KEPPRA) 500 MG tablet, Take 500 mg by mouth 2 (two) times daily., Disp: , Rfl:    lisinopril (ZESTRIL) 5 MG tablet, Take 5 mg by mouth in the morning., Disp: , Rfl:    Menthol, Topical Analgesic, (BIOFREEZE EX), Apply 1 application topically daily as needed (shoulder pain)., Disp: , Rfl:    metFORMIN (GLUCOPHAGE-XR) 500 MG 24 hr tablet, Take 1,000 mg by mouth in the morning and at bedtime., Disp: , Rfl:    metoprolol  tartrate (LOPRESSOR ) 100 MG tablet, Take 1 tablet (100 mg total) by mouth 2 (two) times daily., Disp: 180 tablet, Rfl: 3   Naldemedine Tosylate (SYMPROIC) 0.2 MG  TABS, Take 0.2 mg by mouth daily as needed (constipation)., Disp: , Rfl:    naloxone (NARCAN) nasal spray 4 mg/0.1 mL, Place 0.4 mg into the nose  once., Disp: , Rfl:    Naphazoline HCl (CLEAR EYES OP), Place 1 drop into both eyes daily as needed (itchy eyes)., Disp: , Rfl:    nitroGLYCERIN  (NITROSTAT ) 0.4 MG SL tablet, DISSOLVE 1 TABLET UNDER THE TONGUE EVERY 5 MINUTES AS NEEDED FOR CHEST PAIN (Patient taking differently: Place 0.4 mg under the tongue every 5 (five) minutes as needed for chest pain. DISSOLVE 1 TABLET UNDER THE TONGUE EVERY 5 MINUTES AS NEEDED FOR CHEST PAIN), Disp: 25 tablet, Rfl: 0   oxyCODONE (ROXICODONE) 15 MG immediate release tablet, Take 15 mg by mouth 5 (five) times daily as needed for pain., Disp: , Rfl:    potassium chloride  (KLOR-CON ) 10 MEQ tablet, Take 1 tablet by mouth daily, Disp: 90 tablet, Rfl: 3   pregabalin (LYRICA) 300 MG capsule, Take 300 mg by mouth 2 (two) times daily., Disp: , Rfl:    promethazine (PHENERGAN) 25 MG tablet, Take 25 mg by mouth every 6 (six) hours as needed for nausea or vomiting., Disp: , Rfl:    rosuvastatin  (CRESTOR ) 20 MG tablet, Take 1 tablet (20 mg total) by mouth daily. (Patient taking differently: Take 20 mg by mouth in the morning.), Disp: 90 tablet, Rfl: 3   testosterone cypionate (DEPOTESTOSTERONE CYPIONATE) 200 MG/ML injection, Inject 200 mg into the muscle every 14 (fourteen) days., Disp: , Rfl:    TRESIBA FLEXTOUCH 100 UNIT/ML FlexTouch Pen, Inject 22 Units into the skin at bedtime., Disp: , Rfl:    D3-50 1.25 MG (50000 UT) capsule, Take 50,000 Units by mouth every Sunday. (Patient not taking: Reported on 06/07/2024), Disp: , Rfl:    DILT-XR 120 MG 24 hr capsule, Take 120 mg by mouth daily. (Patient not taking: Reported on 06/07/2024), Disp: , Rfl:    levothyroxine (SYNTHROID) 75 MCG tablet, Take 75 mcg by mouth daily. (Patient not taking: Reported on 06/07/2024), Disp: , Rfl:    metoCLOPramide  (REGLAN ) 10 MG tablet, Take 10 mg by mouth 2 (two) times daily. (Patient not taking: Reported on 06/07/2024), Disp: , Rfl:   Vitals   Vitals:   06/07/24 1329 06/07/24 1330 06/07/24 1345  06/07/24 1415  BP:  (!) 110/95 (!) 137/109 103/85  Pulse:  76 63 61  Resp:  (!) 9 13 18   Temp: 98.2 F (36.8 C)     TempSrc: Oral     SpO2:  97% 95% 98%  Weight:      Height:  5' 10 (1.778 m)      Body mass index is 26.89 kg/m.   Physical Exam   Constitutional: acutely ill Psych: Affect appropriate to situation.   Eyes: No scleral injection.   HENT: No OP obstruction.   Head: Normocephalic.   Cardiovascular: Normal rate and regular rhythm.   Respiratory: Effort normal, non-labored breathing.   GI: Soft.  No distension. There is no tenderness.   Skin: WDI.    Neurologic Examination   Somewhat drowsy, able to answer questions Dysarthric No aphasia Cranial nerves II to XII: Pupils equal round react light, extremity movements intact, visual fields full, face grossly appears symmetric, tongue and palate midline. Motor examination with symmetric drift in all 4 extremities but no focality Sensation intact to light touch Coordination: Has what appears to be somewhat of a asterixis versus baseline tremor-may be consistent with history of Parkinson's or may  be related to a toxic/metabolic etiology.  Labs/Imaging/Neurodiagnostic studies   CBC:  Recent Labs  Lab 06/12/24 1250 Jun 12, 2024 1259  WBC 12.3*  --   NEUTROABS 8.8*  --   HGB 16.2 16.3  HCT 48.3 48.0  MCV 86.3  --   PLT 239  --    Basic Metabolic Panel:  Lab Results  Component Value Date   NA 132 (L) 06-12-24   K 5.1 12-Jun-2024   CO2 22 June 12, 2024   GLUCOSE 209 (H) 06/12/24   BUN 34 (H) 06-12-2024   CREATININE 1.56 (H) 2024/06/12   CALCIUM  8.5 (L) 12-Jun-2024   GFRNONAA 49 (L) 12-Jun-2024   Urine Drug Screen:     Component Value Date/Time   LABOPIA POSITIVE (A) 06/12/2024 1600   COCAINSCRNUR NONE DETECTED 12-Jun-2024 1600   LABBENZ NONE DETECTED 2024/06/12 1600   AMPHETMU NONE DETECTED 12-Jun-2024 1600   THCU NONE DETECTED 2024/06/12 1600   LABBARB NONE DETECTED 06/12/2024 1600    Alcohol Level      Component Value Date/Time   [facility] <15 06/12/24 1255   INR  Lab Results  Component Value Date   INR 1.0 Jun 12, 2024   APTT  Lab Results  Component Value Date   APTT 28 06-12-2024    CT Head without contrast(Personally reviewed): Age-related atrophy and mild to moderate cerebral white matter disease. ASPECTS is 10.  CT angio Head and Neck with contrast(Personally reviewed): No LVO     ASSESSMENT   [facility] is a 63 y.o. male  hx of a fib on Eliquis  (unknown compliance), prior strokes, clotting disorder, DM, HTN, HLD, CAD, anxiety and depression, GERD, HX of PE, hypothyroidism, dysphagia, parkinsons disease, BPH who [facility] via EMS as a code stroke for acute onset of left arm weakness and facial droop CT/CTA head with no acute process, no LVO.  Outside the window for TNK.  No ELVO to pursue EVT. Somewhat altered on exam along with features of parkinsonism. Hypotensive in the ED, along with metabolic derangements such as deranged renal function-will need to consider toxic or infectious etiology for his altered mental status Stroke remains on the differential-further imaging is needed.  MRI incompatible stimulator/implant Other differentials include polypharmacy Exam not very consistent with meningitis but if no other clear source was found, may want to consider an LP. I would first give him some time to be treated for metabolic derangements such as AKI etc.   RECOMMENDATIONS  - MRI brain w/o (unable to obtain -has spinal cord stimulator and penile implant). Will check head CT in 24 hours. If positive for stroke will continue with getting full stroke workup - Seizure precautions  - continue home Keppra  - Recommend heparin  gtt, stroke protocol low dose no boluses until 24 hr brain imaging completed to ensure no acute stroke or hemorrhage   -infectious etiology to include: UA, CBC, CXR, Blood cultures, lactic acid - UDS - recheck CMP - treat any infectious process  and metabolic derangements per primary team  -Unclear if is compliant with Eliquis .  If absolutely necessary, can start heparin  drip stroke protocol but I would wait for 24-hour imaging before resuming anticoagulation - Neurology will continue to follow  ______________________________________________________________________  Signed, Karna DELENA Geralds, NP [facility]  Attending Neurohospitalist Addendum Patient seen and examined with APP/Resident. Agree with the history and physical as documented above. Agree with the plan as documented, which I helped formulate. I have independently reviewed the chart, obtained history, review of systems and  examined the patient.I have personally reviewed pertinent head/neck/spine imaging (CT/MRI). Please feel free to call with any questions.  -- Eligio Lav, MD Neurologist [facility] Pager: [phone]

## 2024-06-07 NOTE — ED Notes (Signed)
 In and out cath done with return. Urinalysis sent to lab.

## 2024-06-07 NOTE — ED Provider Notes (Signed)
 [place] EMERGENCY DEPARTMENT AT [facility] Provider Note   CSN: [id number] Arrival date & time: 06/07/24  1248  An emergency department physician performed an initial assessment on this suspected stroke patient at 1252.  Patient presents with: Altered Mental Status   Charles Huynh is a 63 y.o. male.   Patient is a 63 year old male with a past medical history of Parkinson's, A-fib on Eliquis , CAD, hypertension, diabetes, prior CVA presenting to the emergency department as a code stroke.  Per EMS, his last known well was at 3 AM this morning.  He was called out for weakness and was found to have left-sided facial droop and appeared to be more weak on the left compared to the right.  They state that he had decreased responsiveness and confusion and route to the hospital.  Patient reports to me that he has been weak for the last 2 to 3 days.  He states he feels generally unwell.  Denies any known fevers, nausea, vomiting or diarrhea.  States that he has had a nonproductive cough.  Denies any dysuria or hematuria.  The history is provided by the patient and the EMS personnel. The history is limited by the condition of the patient.  Altered Mental Status      Prior to Admission medications   Medication Sig Start Date End Date Taking? Authorizing Provider  albuterol (VENTOLIN HFA) 108 (90 Base) MCG/ACT inhaler Inhale 2 puffs into the lungs every 4 (four) hours as needed for wheezing or shortness of breath. 12/19/19   [provider]  baclofen (LIORESAL) 10 MG tablet Take 10 mg by mouth 3 (three) times daily as needed for muscle spasms. 07/07/19   [provider]  bismuth subsalicylate (PEPTO BISMOL) 262 MG/15ML suspension Take 30 mLs by mouth every 6 (six) hours as needed for indigestion.    [provider]  carbidopa-levodopa (SINEMET IR) 25-250 MG tablet Take 2 tablets by mouth 2 (two) times daily.    [provider]  citalopram (CELEXA) 20 MG  tablet Take 20 mg by mouth daily.    [provider]  D3-50 1.25 MG (50000 UT) capsule Take 50,000 Units by mouth every Sunday. 01/06/22   [provider]  DILT-XR 120 MG 24 hr capsule Take 120 mg by mouth daily. 08/25/19   [provider]  diphenhydrAMINE  (BENADRYL ) 25 MG tablet Take 25 mg by mouth daily as needed for allergies.    [provider]  ELIQUIS  5 MG TABS tablet Take 1 tablet (5 mg total) by mouth 2 (two) times daily. 10/26/20   Tobb, Kardie, DO  esomeprazole (NEXIUM) 40 MG capsule Take 40 mg by mouth daily.    [provider]  fentaNYL  (DURAGESIC ) 25 MCG/HR Place 1 patch onto the skin as directed. Apply every 2.5 days 01/10/21   [provider]  hydrochlorothiazide  (MICROZIDE ) 12.5 MG capsule Take 1 capsule (12.5 mg total) by mouth daily. Patient taking differently: Take 12.5 mg by mouth every Monday, Wednesday, and Friday. 10/26/20   Tobb, Kardie, DO  hydrocortisone cream 1 % Apply 1 application topically 2 (two) times daily as needed for itching.    [provider]  isosorbide  mononitrate (IMDUR ) 60 MG 24 hr tablet TAKE 1 TABLET(60 MG) BY MOUTH DAILY 12/09/22   Tobb, Kardie, DO  JANUVIA 100 MG tablet Take 100 mg by mouth daily. 08/08/19   [provider]  JARDIANCE 25 MG TABS tablet Take 25 mg by mouth daily. 03/20/20   [provider]  levothyroxine (SYNTHROID) 75 MCG tablet Take 75 mcg by mouth daily. 01/09/22   [provider]  Menthol, Topical Analgesic, (BIOFREEZE EX) Apply 1 application topically daily as needed (shoulder pain).    [provider]  metFORMIN (GLUCOPHAGE-XR) 500 MG 24 hr tablet Take 1,000 mg by mouth daily. 07/17/20   [provider]  metoCLOPramide  (REGLAN ) 10 MG tablet Take 10 mg by mouth 2 (two) times daily. 08/18/19   [provider]  metoprolol  tartrate (LOPRESSOR ) 100 MG tablet Take 1 tablet (100 mg total) by mouth 2 (two) times daily. 10/26/20   Tobb,  Kardie, DO  Naldemedine Tosylate (SYMPROIC) 0.2 MG TABS Take 0.2 mg by mouth daily as needed (constipation).    [provider]  Naphazoline HCl (CLEAR EYES OP) Place 1 drop into both eyes daily as needed (itchy eyes).    [provider]  nitroGLYCERIN  (NITROSTAT ) 0.4 MG SL tablet DISSOLVE 1 TABLET UNDER THE TONGUE EVERY 5 MINUTES AS NEEDED FOR CHEST PAIN 01/21/23   Tobb, Kardie, DO  NUPLAZID 34 MG CAPS Take 1 capsule by mouth daily. 09/12/20   [provider]  OVER THE COUNTER MEDICATION Place 1 spray in ear(s) daily as needed (itchy ears).    [provider]  oxyCODONE (ROXICODONE) 15 MG immediate release tablet Take 15 mg by mouth 5 (five) times daily as needed for pain. 03/27/21   [provider]  potassium chloride  (KLOR-CON ) 10 MEQ tablet Take 1 tablet by mouth daily 10/26/20   Tobb, Kardie, DO  pregabalin (LYRICA) 300 MG capsule Take 300 mg by mouth 2 (two) times daily. 07/28/19   [provider]  promethazine (PHENERGAN) 25 MG tablet Take 25 mg by mouth every 6 (six) hours as needed for nausea or vomiting.    [provider]  ranolazine  (RANEXA ) 1000 MG SR tablet Take 1 tablet (1,000 mg total) by mouth 2 (two) times daily. 02/27/22   Tobb, Kardie, DO  rosuvastatin  (CRESTOR ) 20 MG tablet Take 1 tablet (20 mg total) by mouth daily. 01/14/22 05/19/22  Tobb, Kardie, DO  testosterone cypionate (DEPOTESTOSTERONE CYPIONATE) 200 MG/ML injection Inject 200 mg into the muscle every 14 (fourteen) days. 03/13/20   [provider]  TRESIBA FLEXTOUCH 100 UNIT/ML FlexTouch Pen Inject 22 Units into the skin at bedtime. 03/22/21   [provider]    Allergies: Naproxen sodium and Betadine [povidone iodine]    Review of Systems  Updated Vital Signs BP 103/85   Pulse 61   Temp 98.2 F (36.8 C) (Oral)   Resp 18   Ht 5' 10 (1.778 m)   Wt 85 kg   SpO2 98%   BMI 26.89 kg/m   Physical Exam Vitals and nursing note reviewed.   Constitutional:      General: He is not in acute distress.    Appearance: Normal appearance. He is ill-appearing and diaphoretic.  HENT:     Head: Normocephalic and atraumatic.     Nose: Nose normal.     Mouth/Throat:     Mouth: Mucous membranes are dry.     Pharynx: Oropharynx is clear.   Eyes:     Extraocular Movements: Extraocular movements intact.     Conjunctiva/sclera: Conjunctivae normal.     Pupils: Pupils are equal, round, and reactive to light.    Cardiovascular:     Rate and Rhythm: Normal rate and regular rhythm.     Heart sounds: Normal heart sounds.  Pulmonary:     Effort:  Pulmonary effort is normal.     Breath sounds: Normal breath sounds.  Abdominal:     General: Abdomen is flat.     Palpations: Abdomen is soft.     Tenderness: There is no abdominal tenderness.   Musculoskeletal:        General: Normal range of motion.     Cervical back: Normal range of motion and neck supple. No rigidity.   Skin:    General: Skin is warm.   Neurological:     Mental Status: He is alert.     Comments: Oriented to person and place only No obvious facial droop Drift in all 4 extremities Equal grip strength and plantar/dorsiflexion bilaterally Normal sensation throughout Generalized tremors in bilateral UE and LE  Psychiatric:        Mood and Affect: Mood normal.        Behavior: Behavior normal.     (all labs ordered are listed, but only abnormal results are displayed) Labs Reviewed  CBC - Abnormal; Notable for the following components:      Result Value   WBC 12.3 (*)    All other components within normal limits  DIFFERENTIAL - Abnormal; Notable for the following components:   Neutro Abs 8.8 (*)    All other components within normal limits  COMPREHENSIVE METABOLIC PANEL WITH GFR - Abnormal; Notable for the following components:   Sodium 132 (*)    Glucose, Bld 209 (*)    BUN 34 (*)    Creatinine, Ser 1.56 (*)    Calcium  8.5 (*)    Total Protein 5.5 (*)     Albumin 3.1 (*)    AST 12 (*)    GFR, Estimated 49 (*)    All other components within normal limits  I-STAT CHEM 8, ED - Abnormal; Notable for the following components:   Sodium 133 (*)    Potassium 6.8 (*)    BUN 54 (*)    Creatinine, Ser 1.60 (*)    Glucose, Bld 216 (*)    Calcium , Ion 1.12 (*)    All other components within normal limits  CBG MONITORING, ED - Abnormal; Notable for the following components:   Glucose-Capillary 261 (*)    All other components within normal limits  I-STAT CG4 LACTIC ACID, ED - Abnormal; Notable for the following components:   Lactic Acid, Venous 2.6 (*)    All other components within normal limits  CULTURE, BLOOD (ROUTINE X 2)  CULTURE, BLOOD (ROUTINE X 2)  ETHANOL  PROTIME-INR  APTT  LACTIC ACID, PLASMA  LACTIC ACID, PLASMA  RAPID URINE DRUG SCREEN, HOSP PERFORMED  URINALYSIS, W/ REFLEX TO CULTURE (INFECTION SUSPECTED)  I-STAT CG4 LACTIC ACID, ED    EKG: None  Radiology: DG CHEST PORT 1 VIEW Result Date: 06/07/2024 CLINICAL DATA:  Altered mental status, facial droop EXAM: PORTABLE CHEST 1 VIEW COMPARISON:  03/18/2024 FINDINGS: Stable appearance of cervical plate and screw fixator and left upper abdominal pulse generator with lead electrodes projecting over the midline lower neck. The lungs appear clear. Cardiac and mediastinal contours normal. No blunting of the costophrenic angles. No significant bony findings. IMPRESSION: 1. No acute findings. 2. Cervical plate and screw fixator and left upper abdominal pulse generator with lead electrodes projecting over the midline lower neck. Electronically Signed   By: Ryan Salvage M.D.   On: 06/07/2024 14:38   CT ANGIO HEAD NECK W WO CM W PERF (CODE STROKE) Result Date: 06/07/2024 CLINICAL DATA:  Acute neurological deficit.  EXAM: CT HEAD WITHOUT CONTRAST CT ANGIOGRAPHY OF THE HEAD AND NECK CT PERFUSION BRAIN TECHNIQUE: Contiguous axial images were obtained from the base of the skull through the  vertex without intravenous contrast. Multidetector CT imaging of the head and neck was performed using the standard protocol during bolus administration of intravenous contrast. Multiplanar CT image reconstructions and MIPs were obtained to evaluate the vascular anatomy. Carotid stenosis measurements (when applicable) are obtained utilizing NASCET criteria, using the distal internal carotid diameter as the denominator. Multiphase CT imaging of the brain was performed following IV bolus contrast injection. Subsequent parametric perfusion maps were calculated using RAPID software. RADIATION DOSE REDUCTION: This exam was performed according to the departmental dose-optimization program which includes automated exposure control, adjustment of the mA and/or kV according to patient size and/or use of iterative reconstruction technique. CONTRAST:  75mL OMNIPAQUE  IOHEXOL  350 MG/ML SOLN COMPARISON:  CT of the head dated January 23, 2022. FINDINGS: CT HEAD Brain: Cerebral atrophy and mild to moderate cerebral white matter disease. Vascular: No acute process. Skull: Intact. Sinuses/Orbits: Unremarkable. CTA NECK Aortic arch: Mild calcific atheromatous disease. No evidence of aneurysm or stenosis. Right carotid system: Mild to moderate calcific plaque within the carotid bulb and origin of the internal carotid artery. Mild stenosis of the proximal cervical segment of the internal carotid artery, proximally 20%. Left carotid system: Mild calcific plaque at the origin of the internal carotid artery, which is tortuous, but normal in caliber. Mild atheromatous disease within the common carotid artery without significant stenosis. Vertebral arteries:Left vertebral artery is dominant. Both arteries are normal in caliber throughout the respective courses. Skeleton: Status post ACDF at C5-6. Posterior spinal cord stimulator device is present. Other neck: None. CTA HEAD Anterior circulation: Normal. No evidence of aneurysm, large vessel  occlusion or flow-limiting stenosis. Posterior circulation: Normal. Fetal type origin of the right posterior cerebral artery with diminutive right P1 segment. Venous sinuses: Patent. Anatomic variants: As above. Delayed phase: No abnormal parenchymal or meningeal enhancement. CT Brain Perfusion Findings: ASPECTS: 10. CBF (<30%) Volume: 0mL Perfusion (Tmax>6.0s) volume: 0mL Mismatch Volume: 0mL Infarction Location:Not applicable. IMPRESSION: 1. Mild calcific atheromatous disease within the right internal carotid artery with mild stenosis of the lower cervical segment, proximally 20%. 2. Negative CT angiogram of the head. 3. Normal CT perfusion study. These results were communicated at the time of interpretation on 06/07/2024 at 1:30 pm to Dr. ELIGIO LAV . Electronically Signed   By: Evalene Coho M.D.   On: 06/07/2024 13:38   CT HEAD CODE STROKE WO CONTRAST Result Date: 06/07/2024 CLINICAL DATA:  Code stroke. EXAM: CT HEAD WITHOUT CONTRAST TECHNIQUE: Contiguous axial images were obtained from the base of the skull through the vertex without intravenous contrast. RADIATION DOSE REDUCTION: This exam was performed according to the departmental dose-optimization program which includes automated exposure control, adjustment of the mA and/or kV according to patient size and/or use of iterative reconstruction technique. COMPARISON:  CT of the head dated June 24, 2023. FINDINGS: Brain: Moderate generalized cerebral and cerebellar volume loss and moderate periventricular and deep cerebral white matter disease. No evidence of hemorrhage, mass, acute cortical infarct or hydrocephalus. Vascular: No hyperdense vessel. Moderate calcific atheromatous disease within the carotid siphons. Skull: Intact and unremarkable. Sinuses/Orbits: Minimal mucosal disease within the right maxillary sinus. The orbits are unremarkable. The mastoid air cells are clear. Other: None. ASPECTS [facility] Stroke Program Early CT Score) - Ganglionic  level infarction (caudate, lentiform nuclei, internal capsule, insula, M1-M3 cortex): 7. - Supraganglionic infarction (  M4-M6 cortex): 3. Total score (0-10 with 10 being normal): 10. IMPRESSION: 1. Age-related atrophy and mild to moderate cerebral white matter disease. 2. ASPECTS is 10. 3. These results were called by telephone at the time of interpretation on 06/07/2024 at 1:06 pm to provider Dr. Voncile, who verbally acknowledged these results. Electronically Signed   By: Evalene Coho M.D.   On: 06/07/2024 13:18     Procedures   Medications Ordered in the ED  sodium chloride  flush (NS) 0.9 % injection 3 mL (3 mLs Intravenous Given 06/07/24 1418)  iohexol  (OMNIPAQUE ) 350 MG/ML injection 75 mL (75 mLs Intravenous Contrast Given 06/07/24 1311)  lactated ringers bolus 1,000 mL (1,000 mLs Intravenous New Bag/Given 06/07/24 1417)    Clinical Course as of 06/07/24 1547  Tue Jun 07, 2024  1336 No acute abnormality on CT imaging, low suspicion for stroke. Patient with hypotension and concern for possible sepsis. Dr. Arora with neurology has low suspicion for CNS infection, recommends evaluate for sepsis or alternative etiology for weakness. [VK]  1453 Patient unable to obtain MRI due to neurostimulator, Dr. Voncile recommends repeat [facility] in 24 hr. [VK]  1454 Patient with AKI with Cr 1.56 from baseline normal. Leukocytosis, no acute disease on CXR. [VK]    Clinical Course User Index [VK] Kingsley, Mickeal Daws K, DO                                 Medical Decision Making This patient presents to the ED with chief complaint(s) of weakness, AMS with pertinent past medical history of Parkinson's, A fib, CAD, HTN, DM, CVA which further complicates the presenting complaint. The complaint involves an extensive differential diagnosis and also carries with it a high risk of complications and morbidity.    The differential diagnosis includes ICH, mass effect, CVA, TIA, infection, electrolyte derangement, arrhythmia,  anemia, pneumonia, pneumothorax, pulm edema, pleural effusion  Additional history obtained: Additional history obtained from EMS  Records reviewed Care Everywhere/External Records  ED Course and Reassessment: Patient was made a prehospital arrival code stroke and was evaluated by neurology and myself at the bridge on his arrival.  His airway was intact.  He was initially hemodynamically stable with normal blood sugar  Independent labs interpretation:  The following labs were independently interpreted: AKI, repeat lactic uptrending  Independent visualization of imaging: - I independently visualized the following imaging with scope of interpretation limited to determining acute life threatening conditions related to emergency care: CTH/CTA, CXR, which revealed no acute disease  Consultation: - Consulted or discussed management/test interpretation w/ external professional: neurology, hospitalist  Consideration for admission or further workup: patient requires admission for AMS Social Determinants of health: N/A    Amount and/or Complexity of Data Reviewed Labs: ordered. Radiology: ordered.  Risk Decision regarding hospitalization.       Final diagnoses:  Disorientation  AKI (acute kidney injury) [facility])    ED Discharge Orders     None          Kingsley, Aylanie Cubillos K, DO 06/07/24 1547

## 2024-06-08 ENCOUNTER — Inpatient Hospital Stay (HOSPITAL_COMMUNITY)

## 2024-06-08 DIAGNOSIS — E1143 Type 2 diabetes mellitus with diabetic autonomic (poly)neuropathy: Secondary | ICD-10-CM | POA: Diagnosis present

## 2024-06-08 DIAGNOSIS — R4182 Altered mental status, unspecified: Secondary | ICD-10-CM | POA: Diagnosis not present

## 2024-06-08 DIAGNOSIS — Z79899 Other long term (current) drug therapy: Secondary | ICD-10-CM | POA: Diagnosis not present

## 2024-06-08 DIAGNOSIS — Z7984 Long term (current) use of oral hypoglycemic drugs: Secondary | ICD-10-CM | POA: Diagnosis not present

## 2024-06-08 DIAGNOSIS — Z8249 Family history of ischemic heart disease and other diseases of the circulatory system: Secondary | ICD-10-CM | POA: Diagnosis not present

## 2024-06-08 DIAGNOSIS — I10 Essential (primary) hypertension: Secondary | ICD-10-CM | POA: Diagnosis present

## 2024-06-08 DIAGNOSIS — N138 Other obstructive and reflux uropathy: Secondary | ICD-10-CM | POA: Diagnosis present

## 2024-06-08 DIAGNOSIS — J4489 Other specified chronic obstructive pulmonary disease: Secondary | ICD-10-CM | POA: Diagnosis present

## 2024-06-08 DIAGNOSIS — Z7901 Long term (current) use of anticoagulants: Secondary | ICD-10-CM | POA: Diagnosis not present

## 2024-06-08 DIAGNOSIS — J439 Emphysema, unspecified: Secondary | ICD-10-CM | POA: Diagnosis present

## 2024-06-08 DIAGNOSIS — E039 Hypothyroidism, unspecified: Secondary | ICD-10-CM | POA: Diagnosis present

## 2024-06-08 DIAGNOSIS — G40209 Localization-related (focal) (partial) symptomatic epilepsy and epileptic syndromes with complex partial seizures, not intractable, without status epilepticus: Secondary | ICD-10-CM | POA: Diagnosis present

## 2024-06-08 DIAGNOSIS — K219 Gastro-esophageal reflux disease without esophagitis: Secondary | ICD-10-CM | POA: Diagnosis present

## 2024-06-08 DIAGNOSIS — E782 Mixed hyperlipidemia: Secondary | ICD-10-CM | POA: Diagnosis present

## 2024-06-08 DIAGNOSIS — G9341 Metabolic encephalopathy: Secondary | ICD-10-CM | POA: Diagnosis present

## 2024-06-08 DIAGNOSIS — E860 Dehydration: Secondary | ICD-10-CM | POA: Diagnosis present

## 2024-06-08 DIAGNOSIS — I480 Paroxysmal atrial fibrillation: Secondary | ICD-10-CM | POA: Diagnosis present

## 2024-06-08 DIAGNOSIS — F17210 Nicotine dependence, cigarettes, uncomplicated: Secondary | ICD-10-CM | POA: Diagnosis present

## 2024-06-08 DIAGNOSIS — N179 Acute kidney failure, unspecified: Secondary | ICD-10-CM | POA: Diagnosis present

## 2024-06-08 DIAGNOSIS — G894 Chronic pain syndrome: Secondary | ICD-10-CM | POA: Diagnosis present

## 2024-06-08 DIAGNOSIS — N289 Disorder of kidney and ureter, unspecified: Secondary | ICD-10-CM | POA: Diagnosis not present

## 2024-06-08 DIAGNOSIS — R531 Weakness: Secondary | ICD-10-CM | POA: Diagnosis present

## 2024-06-08 DIAGNOSIS — E872 Acidosis, unspecified: Secondary | ICD-10-CM | POA: Diagnosis present

## 2024-06-08 DIAGNOSIS — Z79890 Hormone replacement therapy (postmenopausal): Secondary | ICD-10-CM | POA: Diagnosis not present

## 2024-06-08 DIAGNOSIS — E871 Hypo-osmolality and hyponatremia: Secondary | ICD-10-CM | POA: Diagnosis present

## 2024-06-08 DIAGNOSIS — G20A1 Parkinson's disease without dyskinesia, without mention of fluctuations: Secondary | ICD-10-CM | POA: Diagnosis present

## 2024-06-08 DIAGNOSIS — F330 Major depressive disorder, recurrent, mild: Secondary | ICD-10-CM | POA: Diagnosis present

## 2024-06-08 LAB — COMPREHENSIVE METABOLIC PANEL WITH GFR
ALT: 8 U/L (ref 0–44)
AST: 15 U/L (ref 15–41)
Albumin: 2.8 g/dL — ABNORMAL LOW (ref 3.5–5.0)
Alkaline Phosphatase: 64 U/L (ref 38–126)
Anion gap: 6 (ref 5–15)
BUN: 23 mg/dL (ref 8–23)
CO2: 23 mmol/L (ref 22–32)
Calcium: 8 mg/dL — ABNORMAL LOW (ref 8.9–10.3)
Chloride: 103 mmol/L (ref 98–111)
Creatinine, Ser: 1.08 mg/dL (ref 0.61–1.24)
GFR, Estimated: >60 mL/min (ref >60)
Glucose, Bld: 162 mg/dL — ABNORMAL HIGH (ref 70–99)
Potassium: 4.4 mmol/L (ref 3.5–5.1)
Sodium: 132 mmol/L — ABNORMAL LOW (ref 135–145)
Total Bilirubin: 0.9 mg/dL (ref 0.0–1.2)
Total Protein: 5.1 g/dL — ABNORMAL LOW (ref 6.5–8.1)

## 2024-06-08 LAB — AMMONIA: Ammonia: <13 umol/L (ref 9–35)

## 2024-06-08 LAB — GLUCOSE, CAPILLARY
Glucose-Capillary: 214 mg/dL — ABNORMAL HIGH (ref 70–99)
Glucose-Capillary: 292 mg/dL — ABNORMAL HIGH (ref 70–99)
Glucose-Capillary: 232 mg/dL — ABNORMAL HIGH (ref 70–99)

## 2024-06-08 LAB — CBC
HCT: 44.1 % (ref 39.0–52.0)
Hemoglobin: 14.8 g/dL (ref 13.0–17.0)
MCH: 29 pg (ref 26.0–34.0)
MCHC: 33.6 g/dL (ref 30.0–36.0)
MCV: 86.3 fL (ref 80.0–100.0)
Platelets: 185 10*3/uL (ref 150–400)
RBC: 5.11 MIL/uL (ref 4.22–5.81)
RDW: 12.5 % (ref 11.5–15.5)
WBC: 10.6 10*3/uL — ABNORMAL HIGH (ref 4.0–10.5)
nRBC: 0 % (ref 0.0–0.2)

## 2024-06-08 LAB — CBG MONITORING, ED: Glucose-Capillary: 153 mg/dL — ABNORMAL HIGH (ref 70–99)

## 2024-06-08 LAB — VITAMIN B12: Vitamin B-12: 253 pg/mL (ref 180–914)

## 2024-06-08 MED ORDER — VITAMIN B-12 1000 MCG PO TABS
1000.0000 ug | ORAL_TABLET | Freq: Every day | ORAL | Status: DC
  Administered 2024-06-08 – 2024-06-09 (×2): 1000 ug via ORAL
  Filled 2024-06-08 (×2): qty 1

## 2024-06-08 MED ORDER — CARBIDOPA-LEVODOPA ER 50-200 MG PO TBCR
1.0000 | EXTENDED_RELEASE_TABLET | Freq: Four times a day (QID) | ORAL | Status: DC
  Administered 2024-06-08 – 2024-06-09 (×5): 1 via ORAL
  Filled 2024-06-08 (×7): qty 1

## 2024-06-08 MED ORDER — SODIUM CHLORIDE 0.9 % IV SOLN: INTRAVENOUS | Status: AC

## 2024-06-08 MED ORDER — APIXABAN 5 MG PO TABS
5.0000 mg | ORAL_TABLET | Freq: Two times a day (BID) | ORAL | Status: DC
  Administered 2024-06-08 – 2024-06-09 (×2): 5 mg via ORAL
  Filled 2024-06-08 (×2): qty 1

## 2024-06-08 NOTE — Progress Notes (Addendum)
 NEUROLOGY CONSULT FOLLOW UP NOTE   Date of service: June 08, 2024 Patient Name: Charles Huynh MRN:  [id number] DOB:  29-Sep-1961  Interval Hx/subjective   RN at bedside. Family at bedside.  Patient just moved from ED to 3W room. Endorses pain all over and is oriented, able to follow commands.   PENDING repeat CTH.   Vitals   Vitals:   06/08/24 0330 06/08/24 0629 06/08/24 0630 06/08/24 0700  BP: (!) 115/58  119/89 99/69  Pulse: 71  77 74  Resp: 14  12 12   Temp:  98.7 F (37.1 C)    TempSrc:  Axillary    SpO2: 93%  99% 97%  Weight:      Height:         Body mass index is 26.89 kg/m.  Physical Exam   Constitutional: Appears disheveled, improved from previous exam.  Cardiovascular: Normal rate and regular rhythm.  Respiratory: Effort normal, non-labored breathing. Room air.   Neurologic Examination   Awake and alert. Oriented to name, age and place. Follows commands.  Improved dysarthria, no aphasia.  CN II-XII grossly intact.  Continued BLE weakness due to chronic back pain  Sensation intact, seemingly symmetric.  Coordination. Continued bilateral asterixis versus tremor.   Medications  Current Facility-Administered Medications:    0.9 %  sodium chloride  infusion, , Intravenous, Continuous, Melvin, Alexander B, MD, Last Rate: 125 mL/hr at 06/08/24 0056, New Bag at 06/08/24 0056   acetaminophen  (TYLENOL ) tablet 650 mg, 650 mg, Oral, Q6H PRN **OR** acetaminophen  (TYLENOL ) suppository 650 mg, 650 mg, Rectal, Q6H PRN, Melvin, Alexander B, MD   albuterol (PROVENTIL) (2.5 MG/3ML) 0.083% nebulizer solution 3 mL, 3 mL, Inhalation, Q4H PRN, Melvin, Alexander B, MD   carbidopa-levodopa (SINEMET CR) 50-200 MG per tablet controlled release 1 tablet, 1 tablet, Oral, QID, Regalado, Belkys A, MD   citalopram (CELEXA) tablet 20 mg, 20 mg, Oral, q AM, Melvin, Alexander B, MD, 20 mg at 06/08/24 9389   clopidogrel (PLAVIX) tablet 75 mg, 75 mg, Oral, q AM, Melvin, Alexander B, MD, 75  mg at 06/08/24 9389   fentaNYL  (DURAGESIC ) 25 MCG/HR 1 patch, 1 patch, Transdermal, UD, Melvin, Alexander B, MD   hydrochlorothiazide  (HYDRODIURIL ) tablet 12.5 mg, 12.5 mg, Oral, Q M,W,F, Melvin, Alexander B, MD   insulin aspart (novoLOG) injection 0-9 Units, 0-9 Units, Subcutaneous, TID WC, Melvin, Alexander B, MD   insulin glargine-yfgn [facility]) injection 10 Units, 10 Units, Subcutaneous, QHS, Melvin, Alexander B, MD, 10 Units at 06/07/24 2224   levETIRAcetam (KEPPRA) tablet 500 mg, 500 mg, Oral, BID, Melvin, Alexander B, MD, 500 mg at 06/07/24 2219   levothyroxine (SYNTHROID) tablet 75 mcg, 75 mcg, Oral, Q0600, Melvin, Alexander B, MD, 75 mcg at 06/08/24 0600   lisinopril (ZESTRIL) tablet 5 mg, 5 mg, Oral, q AM, Melvin, Alexander B, MD   metoprolol  tartrate (LOPRESSOR ) tablet 100 mg, 100 mg, Oral, BID, Melvin, Alexander B, MD, 100 mg at 06/07/24 2219   nicotine (NICODERM CQ - dosed in mg/24 hours) patch 14 mg, 14 mg, Transdermal, Daily, Franky Redia SAILOR, MD, 14 mg at 06/07/24 2223   oxyCODONE (Oxy IR/ROXICODONE) immediate release tablet 15 mg, 15 mg, Oral, Q6H PRN, Melvin, Alexander B, MD, 15 mg at 06/08/24 0600   pantoprazole (PROTONIX) EC tablet 40 mg, 40 mg, Oral, Daily, Melvin, Alexander B, MD   polyethylene glycol (MIRALAX / GLYCOLAX) packet 17 g, 17 g, Oral, Daily PRN, Melvin, Alexander B, MD   pregabalin (LYRICA) capsule 300 mg, 300 mg,  Oral, BID, Melvin, Alexander B, MD, 300 mg at 06/07/24 2219   rosuvastatin  (CRESTOR ) tablet 20 mg, 20 mg, Oral, q AM, Melvin, Alexander B, MD, 20 mg at 06/08/24 9389   sodium chloride  flush (NS) 0.9 % injection 3 mL, 3 mL, Intravenous, Q12H, Melvin, Alexander B, MD, 3 mL at 06/07/24 2243   umeclidinium-vilanterol (ANORO ELLIPTA) 62.5-25 MCG/ACT 1 puff, 1 puff, Inhalation, Daily, Seena Marsa NOVAK, MD  Labs and Diagnostic Imaging   CBC:  Recent Labs  Lab 06/07/24 1250 06/07/24 1259 06/08/24 0602  WBC 12.3*  --  10.6*  NEUTROABS 8.8*  --   --    HGB 16.2 16.3 14.8  HCT 48.3 48.0 44.1  MCV 86.3  --  86.3  PLT 239  --  185    Basic Metabolic Panel:  Lab Results  Component Value Date   NA 132 (L) 06/07/2024   K 5.1 06/07/2024   CO2 22 06/07/2024   GLUCOSE 209 (H) 06/07/2024   BUN 34 (H) 06/07/2024   CREATININE 1.56 (H) 06/07/2024   CALCIUM  8.5 (L) 06/07/2024   GFRNONAA 49 (L) 06/07/2024   Urine Drug Screen:     Component Value Date/Time   LABOPIA POSITIVE (A) 06/07/2024 1600   COCAINSCRNUR NONE DETECTED 06/07/2024 1600   LABBENZ NONE DETECTED 06/07/2024 1600   AMPHETMU NONE DETECTED 06/07/2024 1600   THCU NONE DETECTED 06/07/2024 1600   LABBARB NONE DETECTED 06/07/2024 1600    Alcohol Level     Component Value Date/Time   [facility] <15 06/07/2024 1255   INR  Lab Results  Component Value Date   INR 1.0 06/07/2024   APTT  Lab Results  Component Value Date   APTT 28 06/07/2024   CT Head without contrast(Personally reviewed): Age-related atrophy and mild to moderate cerebral white matter disease. ASPECTS is 10.   CT angio Head and Neck with contrast(Personally reviewed): No LVO   Repeat CT Head without contrast (Personally reviewed): PENDING   B12 level-253.  Assessment   KEONTA ALSIP is a 63 y.o. male  hx of a fib on Eliquis  (unknown compliance), prior strokes, clotting disorder, DM, HTN, HLD, CAD, anxiety and depression, GERD, HX of PE, hypothyroidism, dysphagia, parkinsons disease, BPH who presents to [facility] ED via EMS as a code stroke for acute onset of left arm weakness and facial droop CT/CTA head with no acute process, no LVO.  Outside the window for TNK.  No ELVO to pursue EVT.Unable to get MRI due to spinal cord stimulator/implant. Repeat CTH pending  Altered on exam with features of parkinsonism on arrival exam.  Hypotensive in the ED, along with metabolic derangements such as deranged renal function.  Somewhat improved this morning.  Also on opiates.  Might have been contributing from  polypharmacy contributing to altered mental status alteration.  Also has low B12 from a neurological standard.  Replete for a level greater than 400.   Recommendations  His examination does not look very consistent with a stroke. I would work his toxic metabolic derangements per primary team and treat them. I will follow-up on the repeat head CT. Unless there is an evidence of a large stroke on the repeat head CT, it is okay to resume anticoagulation. Continue home Keppra for his history of seizures Continue high intensity statin for his stroke prevention and CAD. B12 supplementation  Plan relayed to Dr. Madelyne CURIA [facility] repeated - negative for acute process.  Plan as above.  ______________________________________________________________________   Bonney Rocky BROCKS  Judithe, NP [facility]   Attending Neurohospitalist Addendum Patient seen and examined with APP/Resident. Agree with the history and physical as documented above. Agree with the plan as documented, which I helped formulate. I have independently reviewed the chart, obtained history, review of systems and examined the patient.I have personally reviewed pertinent head/neck/spine imaging (CT/MRI). Please feel free to call with any questions.  -- Eligio Lav, MD Neurologist [facility] Pager: [phone]

## 2024-06-08 NOTE — Progress Notes (Signed)
 PROGRESS NOTE    Charles Huynh  FMW:[id number] DOB: February 11, 1961 DOA: 06/07/2024 PCP: Claudene Darice HERO, MD   Brief Narrative: 63 year old with past medical history significant for hypertension, hyperlipidemia, CVA, hypothyroidism, paroxysmal A-fib, diabetes, CAD, CAD, BPH, depression, anxiety, Parkinson's disease, seizure, chronic bronchitis, PE, chronic pain syndrome status post spinal stimulator presents with weakness.  Patient presented with 2 or 3 days of weakness generalized not feeling well also noted to have left-sided facial droop and left greater than right-sided weakness.  Outpatient became less responsive as well.  He had some hypotension which resolved with IV fluids in the ED.  CT head showed no acute abnormality. CTA head and neck with perfusion showed mild right carotid atherosclerosis but otherwise no acute abnormality.    Assessment & Plan:   Active Problems:   Essential hypertension   PAF (paroxysmal atrial fibrillation) (HCC)   Hypothyroidism   Anxiety   Type 2 diabetes mellitus without complication (HCC)   Hx pulmonary embolism   Hyperlipidemia   Hypertension   Parkinson's disease (HCC)   Chronic pain syndrome   Gastroesophageal reflux disease without esophagitis   History of CVA (cerebrovascular accident)   Benign prostatic hyperplasia with urinary obstruction   Mild episode of recurrent major depressive disorder (HCC)   Coronary artery disease of native artery of native heart with stable angina pectoris (HCC)   Complex partial seizure disorder (HCC)   Acute encephalopathy   AKI (acute kidney injury) (HCC)   Weakness   Pulmonary emphysema (HCC)   1-Acute Metabolic encephalopathy - Presents with weakness, EMS had concern for left facial droop and left greater than right weakness - CT head and neck and angiogram negative for acute abnormalities - Unable to perform MRI due to stimulator.  Plan to proceed with CT head in 24 hours. - Workup for an infectious  process: UA negative, follow Blood cultures.  Chest x-ray no acute findings TSH: 4.1, ammonia less than 13, B12 253.  Will start supplementation, thiamine pending  AKI:  Patient with a creatinine of 1.5 Suspect hypovolemia dehydration Continue with IV fluids Fattening down to 1.0   Hypotension; Responded to  IV fluids Will hold hydrochlorothiazide  and lisinopril Infectious work up negative.   Lactic acidosis - Failure related to hypoperfusion and dehydration.  Resolved with IV fluids  Hypertension - Hold hydrochlorothiazide  and lisinopril  Hyperlipidemia Continue rosuvastatin   CVA; Continue rosuvastatin   Seizure -Continue with  Keppra  Hypothyroidism: - Continue with Synthroid  Paroxysmal A-fib - Continue with metoprolol  -holding eliquis  until repeated CT head.   Diabetes: - Continue sliding scale insulin-Semglee 10 units daily  GERD:  -Continue with Protonix  CAD: Continue statin  and Plavix  Recent stenting in March at [facility] with plan for 12 months of Plavix in addition to Eliquis . (As above, patient thought he was supposed to stop Eliquis )   Emphysema: Continue Anoro Ellipta  Depression, Anxiety:  - Continue Lyrica and Celexa  Parkinson Diseases:  - Continue Sinemet  History of PE.  Some Eliquis  when okay by neurology  Chronic Pain.  Continue as needed oxycodone  Estimated body mass index is 26.89 kg/m as calculated from the following:   Height as of this encounter: 5' 10 (1.778 m).   Weight as of this encounter: 85 kg.   DVT prophylaxis: SCD Code Status: Full code Family Communication: care discussed with wife who was at bedside Disposition Plan:  Status is: Observation The patient will require care spanning > 2 midnights and should be moved to  inpatient because: management of weakness    Consultants:  Neurology   Procedures:    Antimicrobials:    Subjective: He report blurry vision is better. He report still feeling  generalized weakness.   Objective: Vitals:   06/08/24 0330 06/08/24 0629 06/08/24 0630 06/08/24 0700  BP: (!) 115/58  119/89 99/69  Pulse: 71  77 74  Resp: 14  12 12   Temp:  98.7 F (37.1 C)    TempSrc:  Axillary    SpO2: 93%  99% 97%  Weight:      Height:        Intake/Output Summary (Last 24 hours) at 06/08/2024 0821 Last data filed at 06/08/2024 9388 Gross per 24 hour  Intake 1000 ml  Output 1700 ml  Net -700 ml   Filed Weights   06/07/24 1200  Weight: 85 kg    Examination:  General exam: Appears calm and comfortable  Respiratory system: Clear to auscultation. Respiratory effort normal. Cardiovascular system: S1 & S2 heard, RRR. No JVD, murmurs, rubs, gallops or clicks. No pedal edema. Gastrointestinal system: Abdomen is nondistended, soft and nontender. No organomegaly or masses felt. Normal bowel sounds heard. Central nervous system: Alert and oriented. Generalized weakness Extremities: Symmetric 5 x 5 power.   Data Reviewed: I have personally reviewed following labs and imaging studies  CBC: Recent Labs  Lab 06/07/24 1250 06/07/24 1259 06/08/24 0602  WBC 12.3*  --  10.6*  NEUTROABS 8.8*  --   --   HGB 16.2 16.3 14.8  HCT 48.3 48.0 44.1  MCV 86.3  --  86.3  PLT 239  --  185   Basic Metabolic Panel: Recent Labs  Lab 06/07/24 1259 06/07/24 1350  NA 133* 132*  K 6.8* 5.1  CL 101 101  CO2  --  22  GLUCOSE 216* 209*  BUN 54* 34*  CREATININE 1.60* 1.56*  CALCIUM   --  8.5*   GFR: Estimated Creatinine Clearance: 49.4 mL/min (A) (by C-G formula based on SCr of 1.56 mg/dL (H)). Liver Function Tests: Recent Labs  Lab 06/07/24 1350  AST 12*  ALT 5  ALKPHOS 64  BILITOT 0.7  PROT 5.5*  ALBUMIN 3.1*   No results for input(s): LIPASE, AMYLASE in the last 168 hours. No results for input(s): AMMONIA in the last 168 hours. Coagulation Profile: Recent Labs  Lab 06/07/24 1350  INR 1.0   Cardiac Enzymes: No results for input(s): CKTOTAL,  CKMB, CKMBINDEX, TROPONINI in the last 168 hours. BNP (last 3 results) No results for input(s): PROBNP in the last 8760 hours. HbA1C: No results for input(s): HGBA1C in the last 72 hours. CBG: Recent Labs  Lab 06/07/24 1251 06/07/24 2222 06/08/24 0743  GLUCAP 261* 183* 153*   Lipid Profile: No results for input(s): CHOL, HDL, LDLCALC, TRIG, CHOLHDL, LDLDIRECT in the last 72 hours. Thyroid Function Tests: Recent Labs    06/07/24 1350  TSH 4.127   Anemia Panel: No results for input(s): VITAMINB12, FOLATE, FERRITIN, TIBC, IRON, RETICCTPCT in the last 72 hours. Sepsis Labs: Recent Labs  Lab 06/07/24 1350 06/07/24 1526 06/07/24 1630  LATICACIDVEN 1.0 2.6* 1.9    No results found for this or any previous visit (from the past 240 hours).       Radiology Studies: DG CHEST PORT 1 VIEW Result Date: 06/07/2024 CLINICAL DATA:  Altered mental status, facial droop EXAM: PORTABLE CHEST 1 VIEW COMPARISON:  03/18/2024 FINDINGS: Stable appearance of cervical plate and screw fixator and left upper abdominal pulse generator with  lead electrodes projecting over the midline lower neck. The lungs appear clear. Cardiac and mediastinal contours normal. No blunting of the costophrenic angles. No significant bony findings. IMPRESSION: 1. No acute findings. 2. Cervical plate and screw fixator and left upper abdominal pulse generator with lead electrodes projecting over the midline lower neck. Electronically Signed   By: Ryan Salvage M.D.   On: 06/07/2024 14:38   CT ANGIO HEAD NECK W WO CM W PERF (CODE STROKE) Result Date: 06/07/2024 CLINICAL DATA:  Acute neurological deficit. EXAM: CT HEAD WITHOUT CONTRAST CT ANGIOGRAPHY OF THE HEAD AND NECK CT PERFUSION BRAIN TECHNIQUE: Contiguous axial images were obtained from the base of the skull through the vertex without intravenous contrast. Multidetector CT imaging of the head and neck was performed using the standard  protocol during bolus administration of intravenous contrast. Multiplanar CT image reconstructions and MIPs were obtained to evaluate the vascular anatomy. Carotid stenosis measurements (when applicable) are obtained utilizing NASCET criteria, using the distal internal carotid diameter as the denominator. Multiphase CT imaging of the brain was performed following IV bolus contrast injection. Subsequent parametric perfusion maps were calculated using RAPID software. RADIATION DOSE REDUCTION: This exam was performed according to the departmental dose-optimization program which includes automated exposure control, adjustment of the mA and/or kV according to patient size and/or use of iterative reconstruction technique. CONTRAST:  75mL OMNIPAQUE  IOHEXOL  350 MG/ML SOLN COMPARISON:  CT of the head dated January 23, 2022. FINDINGS: CT HEAD Brain: Cerebral atrophy and mild to moderate cerebral white matter disease. Vascular: No acute process. Skull: Intact. Sinuses/Orbits: Unremarkable. CTA NECK Aortic arch: Mild calcific atheromatous disease. No evidence of aneurysm or stenosis. Right carotid system: Mild to moderate calcific plaque within the carotid bulb and origin of the internal carotid artery. Mild stenosis of the proximal cervical segment of the internal carotid artery, proximally 20%. Left carotid system: Mild calcific plaque at the origin of the internal carotid artery, which is tortuous, but normal in caliber. Mild atheromatous disease within the common carotid artery without significant stenosis. Vertebral arteries:Left vertebral artery is dominant. Both arteries are normal in caliber throughout the respective courses. Skeleton: Status post ACDF at C5-6. Posterior spinal cord stimulator device is present. Other neck: None. CTA HEAD Anterior circulation: Normal. No evidence of aneurysm, large vessel occlusion or flow-limiting stenosis. Posterior circulation: Normal. Fetal type origin of the right posterior  cerebral artery with diminutive right P1 segment. Venous sinuses: Patent. Anatomic variants: As above. Delayed phase: No abnormal parenchymal or meningeal enhancement. CT Brain Perfusion Findings: ASPECTS: 10. CBF (<30%) Volume: 0mL Perfusion (Tmax>6.0s) volume: 0mL Mismatch Volume: 0mL Infarction Location:Not applicable. IMPRESSION: 1. Mild calcific atheromatous disease within the right internal carotid artery with mild stenosis of the lower cervical segment, proximally 20%. 2. Negative CT angiogram of the head. 3. Normal CT perfusion study. These results were communicated at the time of interpretation on 06/07/2024 at 1:30 pm to Dr. ELIGIO LAV . Electronically Signed   By: Evalene Coho M.D.   On: 06/07/2024 13:38   CT HEAD CODE STROKE WO CONTRAST Result Date: 06/07/2024 CLINICAL DATA:  Code stroke. EXAM: CT HEAD WITHOUT CONTRAST TECHNIQUE: Contiguous axial images were obtained from the base of the skull through the vertex without intravenous contrast. RADIATION DOSE REDUCTION: This exam was performed according to the departmental dose-optimization program which includes automated exposure control, adjustment of the mA and/or kV according to patient size and/or use of iterative reconstruction technique. COMPARISON:  CT of the head dated June 24, 2023.  FINDINGS: Brain: Moderate generalized cerebral and cerebellar volume loss and moderate periventricular and deep cerebral white matter disease. No evidence of hemorrhage, mass, acute cortical infarct or hydrocephalus. Vascular: No hyperdense vessel. Moderate calcific atheromatous disease within the carotid siphons. Skull: Intact and unremarkable. Sinuses/Orbits: Minimal mucosal disease within the right maxillary sinus. The orbits are unremarkable. The mastoid air cells are clear. Other: None. ASPECTS [facility] Stroke Program Early CT Score) - Ganglionic level infarction (caudate, lentiform nuclei, internal capsule, insula, M1-M3 cortex): 7. - Supraganglionic  infarction (M4-M6 cortex): 3. Total score (0-10 with 10 being normal): 10. IMPRESSION: 1. Age-related atrophy and mild to moderate cerebral white matter disease. 2. ASPECTS is 10. 3. These results were called by telephone at the time of interpretation on 06/07/2024 at 1:06 pm to provider Dr. Voncile, who verbally acknowledged these results. Electronically Signed   By: Evalene Coho M.D.   On: 06/07/2024 13:18        Scheduled Meds:  carbidopa-levodopa  1 tablet Oral TID   citalopram  20 mg Oral q AM   clopidogrel  75 mg Oral q AM   fentaNYL   1 patch Transdermal UD   hydrochlorothiazide   12.5 mg Oral Q M,W,F   insulin aspart  0-9 Units Subcutaneous TID WC   insulin glargine-yfgn  10 Units Subcutaneous QHS   levETIRAcetam  500 mg Oral BID   levothyroxine  75 mcg Oral Q0600   lisinopril  5 mg Oral q AM   metoprolol  tartrate  100 mg Oral BID   nicotine  14 mg Transdermal Daily   pantoprazole  40 mg Oral Daily   pregabalin  300 mg Oral BID   rosuvastatin   20 mg Oral q AM   sodium chloride  flush  3 mL Intravenous Q12H   umeclidinium-vilanterol  1 puff Inhalation Daily   Continuous Infusions:  sodium chloride  125 mL/hr at 06/08/24 0056     LOS: 0 days    Time spent: 35 minutes    Sayana Salley A Landy Mace, MD [facility]   If 7PM-7AM, please contact night-coverage [email or web address]  06/08/2024, 8:21 AM

## 2024-06-08 NOTE — Discharge Instructions (Signed)
Information on my medicine - ELIQUIS® (apixaban) ° °Why was Eliquis® prescribed for you? °Eliquis® was prescribed for you to reduce the risk of forming blood clots that can cause a stroke if you have a medical condition called atrial fibrillation (a type of irregular heartbeat) OR to reduce the risk of a blood clots forming after orthopedic surgery. ° °What do You need to know about Eliquis® ? °Take your Eliquis® TWICE DAILY - one tablet in the morning and one tablet in the evening with or without food.  It would be best to take the doses about the same time each day. ° °If you have difficulty swallowing the tablet whole please discuss with your pharmacist how to take the medication safely. ° °Take Eliquis® exactly as prescribed by your doctor and DO NOT stop taking Eliquis® without talking to the doctor who prescribed the medication.  Stopping may increase your risk of developing a new clot or stroke.  Refill your prescription before you run out. ° °After discharge, you should have regular check-up appointments with your healthcare provider that is prescribing your Eliquis®.  In the future your dose may need to be changed if your kidney function or weight changes by a significant amount or as you get older. ° °What do you do if you miss a dose? °If you miss a dose, take it as soon as you remember on the same day and resume taking twice daily.  Do not take more than one dose of ELIQUIS at the same time. ° °Important Safety Information °A possible side effect of Eliquis® is bleeding. You should call your healthcare provider right away if you experience any of the following: °? Bleeding from an injury or your nose that does not stop. °? Unusual colored urine (red or dark brown) or unusual colored stools (red or black). °? Unusual bruising for unknown reasons. °? A serious fall or if you hit your head (even if there is no bleeding). ° °Some medicines may interact with Eliquis® and might increase your risk of bleeding  or clotting while on Eliquis®. To help avoid this, consult your healthcare provider or pharmacist prior to using any new prescription or non-prescription medications, including herbals, vitamins, non-steroidal anti-inflammatory drugs (NSAIDs) and supplements. ° °This website has more information on Eliquis® (apixaban): [email or web address].com. ° ° ° °

## 2024-06-08 NOTE — ED Notes (Signed)
 Randine notified patient coming up

## 2024-06-08 NOTE — ED Notes (Signed)
 Regaldo MD contact via secure chat over pt having heavily blured vision this am, RN asked to page neurology.

## 2024-06-08 NOTE — Plan of Care (Signed)

## 2024-06-08 NOTE — ED Notes (Signed)
 Dr Voncile aware of neuro changes and will see the patient on 3W

## 2024-06-08 NOTE — Plan of Care (Signed)
   Problem: Fluid Volume: Goal: Ability to maintain a balanced intake and output will improve Outcome: Progressing

## 2024-06-09 ENCOUNTER — Other Ambulatory Visit (HOSPITAL_BASED_OUTPATIENT_CLINIC_OR_DEPARTMENT_OTHER): Payer: Self-pay

## 2024-06-09 ENCOUNTER — Other Ambulatory Visit: Payer: Self-pay

## 2024-06-09 DIAGNOSIS — E860 Dehydration: Secondary | ICD-10-CM | POA: Diagnosis not present

## 2024-06-09 DIAGNOSIS — G20A1 Parkinson's disease without dyskinesia, without mention of fluctuations: Secondary | ICD-10-CM | POA: Diagnosis not present

## 2024-06-09 DIAGNOSIS — N289 Disorder of kidney and ureter, unspecified: Secondary | ICD-10-CM | POA: Diagnosis not present

## 2024-06-09 DIAGNOSIS — R531 Weakness: Secondary | ICD-10-CM | POA: Diagnosis not present

## 2024-06-09 LAB — GLUCOSE, CAPILLARY
Glucose-Capillary: 231 mg/dL — ABNORMAL HIGH (ref 70–99)
Glucose-Capillary: 236 mg/dL — ABNORMAL HIGH (ref 70–99)
Glucose-Capillary: 133 mg/dL — ABNORMAL HIGH (ref 70–99)

## 2024-06-09 LAB — CULTURE, BLOOD (ROUTINE X 2)

## 2024-06-09 MED ORDER — NICOTINE 14 MG/24HR TD PT24
14.0000 mg | MEDICATED_PATCH | Freq: Every day | TRANSDERMAL | 0 refills | Status: DC
  Filled 2024-06-09: qty 28, 28d supply, fill #0

## 2024-06-09 MED ORDER — CYANOCOBALAMIN 1000 MCG PO TABS
1000.0000 ug | ORAL_TABLET | Freq: Every day | ORAL | 1 refills | Status: DC
  Filled 2024-06-09: qty 100, 100d supply, fill #0

## 2024-06-09 MED ORDER — APIXABAN 5 MG PO TABS: 5.0000 mg | ORAL_TABLET | Freq: Two times a day (BID) | ORAL | 2 refills | Status: AC

## 2024-06-09 MED ORDER — LEVOTHYROXINE SODIUM 75 MCG PO TABS: 75.0000 ug | ORAL_TABLET | Freq: Every day | ORAL | 0 refills | Status: AC

## 2024-06-09 MED ORDER — LEVOTHYROXINE SODIUM 75 MCG PO TABS
75.0000 ug | ORAL_TABLET | Freq: Every day | ORAL | 0 refills | Status: DC
  Filled 2024-06-09: qty 30, 30d supply, fill #0

## 2024-06-09 MED ORDER — APIXABAN 5 MG PO TABS
5.0000 mg | ORAL_TABLET | Freq: Two times a day (BID) | ORAL | 2 refills | Status: DC
  Filled 2024-06-09: qty 60, 30d supply, fill #0

## 2024-06-09 MED ORDER — CYANOCOBALAMIN 1000 MCG PO TABS: 1000.0000 ug | ORAL_TABLET | Freq: Every day | ORAL | 1 refills | Status: AC

## 2024-06-09 MED ORDER — EMPAGLIFLOZIN 25 MG PO TABS
25.0000 mg | ORAL_TABLET | Freq: Every morning | ORAL | Status: DC
  Administered 2024-06-09: 25 mg via ORAL
  Filled 2024-06-09: qty 1

## 2024-06-09 MED ORDER — NICOTINE 14 MG/24HR TD PT24: 14.0000 mg | MEDICATED_PATCH | Freq: Every day | TRANSDERMAL | 0 refills | Status: AC

## 2024-06-09 NOTE — Progress Notes (Signed)
 NEUROLOGY CONSULT FOLLOW UP NOTE   Date of service: June 09, 2024 Patient Name: Charles Huynh MRN:  [id number] DOB:  12-28-1960  Interval Hx/subjective   Patient much improved on exam this morning. Awake, alert, increased ROM in all extremities. Bilateral tremors at rest continue BUE (baseline; hx of Parkinsons).   Vitals   Vitals:   06/08/24 1625 06/08/24 1931 06/08/24 2315 06/09/24 0341  BP: (!) 132/58 133/75 (!) 142/84 120/68  Pulse: 75 74 74 71  Resp: 18 18 18 18   Temp: 97.7 F (36.5 C) 97.8 F (36.6 C) 99.1 F (37.3 C) 97.6 F (36.4 C)  TempSrc: Oral  Oral Oral  SpO2: 97% 97% 100% 94%  Weight:      Height:         Body mass index is 26.89 kg/m.  Physical Exam   Constitutional: Appears disheveled, improved from previous exam.  Cardiovascular: Normal rate and regular rhythm.  Respiratory: Effort normal, non-labored breathing. Room air.   Neurologic Examination   Awake and alert. Oriented to name, age and place. Follows commands.  Improved dysarthria, no aphasia.  CN II-XII grossly intact.  Continued BLE weakness due to chronic back pain  Sensation intact, seemingly symmetric.  Coordination. Continued bilateral asterixis versus tremor.   Medications  Current Facility-Administered Medications:    0.9 %  sodium chloride  infusion, , Intravenous, Continuous, Regalado, Belkys A, MD, Stopped at 06/09/24 0753   acetaminophen  (TYLENOL ) tablet 650 mg, 650 mg, Oral, Q6H PRN **OR** acetaminophen  (TYLENOL ) suppository 650 mg, 650 mg, Rectal, Q6H PRN, Melvin, Alexander B, MD   albuterol (PROVENTIL) (2.5 MG/3ML) 0.083% nebulizer solution 3 mL, 3 mL, Inhalation, Q4H PRN, Melvin, Alexander B, MD   apixaban  (ELIQUIS ) tablet 5 mg, 5 mg, Oral, BID, Regalado, Belkys A, MD, 5 mg at 06/08/24 1709   carbidopa-levodopa (SINEMET CR) 50-200 MG per tablet controlled release 1 tablet, 1 tablet, Oral, QID, Regalado, Belkys A, MD, 1 tablet at 06/08/24 2123   citalopram (CELEXA) tablet 20  mg, 20 mg, Oral, q AM, Melvin, Alexander B, MD, 20 mg at 06/09/24 9482   clopidogrel (PLAVIX) tablet 75 mg, 75 mg, Oral, q AM, Melvin, Alexander B, MD, 75 mg at 06/09/24 0517   cyanocobalamin (VITAMIN B12) tablet 1,000 mcg, 1,000 mcg, Oral, Daily, Regalado, Belkys A, MD, 1,000 mcg at 06/08/24 1709   empagliflozin (JARDIANCE) tablet 25 mg, 25 mg, Oral, q AM, Regalado, Belkys A, MD   fentaNYL  (DURAGESIC ) 25 MCG/HR 1 patch, 1 patch, Transdermal, UD, Seena Marsa NOVAK, MD, 1 patch at 06/08/24 1134   insulin aspart (novoLOG) injection 0-9 Units, 0-9 Units, Subcutaneous, TID WC, Melvin, Alexander B, MD, 5 Units at 06/08/24 1710   insulin glargine-yfgn (SEMGLEE) injection 10 Units, 10 Units, Subcutaneous, QHS, Melvin, Alexander B, MD, 10 Units at 06/08/24 2128   levETIRAcetam (KEPPRA) tablet 500 mg, 500 mg, Oral, BID, Melvin, Alexander B, MD, 500 mg at 06/08/24 2123   levothyroxine (SYNTHROID) tablet 75 mcg, 75 mcg, Oral, Q0600, Melvin, Alexander B, MD, 75 mcg at 06/09/24 9482   metoprolol  tartrate (LOPRESSOR ) tablet 100 mg, 100 mg, Oral, BID, Melvin, Alexander B, MD, 100 mg at 06/08/24 2123   nicotine (NICODERM CQ - dosed in mg/24 hours) patch 14 mg, 14 mg, Transdermal, Daily, Franky Redia SAILOR, MD, 14 mg at 06/08/24 9061   oxyCODONE (Oxy IR/ROXICODONE) immediate release tablet 15 mg, 15 mg, Oral, Q6H PRN, Melvin, Alexander B, MD, 15 mg at 06/09/24 0054   pantoprazole (PROTONIX) EC tablet 40 mg, 40  mg, Oral, Daily, Melvin, Alexander B, MD, 40 mg at 06/08/24 9062   polyethylene glycol (MIRALAX / GLYCOLAX) packet 17 g, 17 g, Oral, Daily PRN, Melvin, Alexander B, MD   pregabalin (LYRICA) capsule 300 mg, 300 mg, Oral, BID, Melvin, Alexander B, MD, 300 mg at 06/08/24 2123   rosuvastatin  (CRESTOR ) tablet 20 mg, 20 mg, Oral, q AM, Melvin, Alexander B, MD, 20 mg at 06/09/24 0518   sodium chloride  flush (NS) 0.9 % injection 3 mL, 3 mL, Intravenous, Q12H, Melvin, Alexander B, MD, 3 mL at 06/08/24 2128    umeclidinium-vilanterol (ANORO ELLIPTA) 62.5-25 MCG/ACT 1 puff, 1 puff, Inhalation, Daily, Seena Marsa NOVAK, MD, 1 puff at 06/08/24 1205  Labs and Diagnostic Imaging   CBC:  Recent Labs  Lab 06/07/24 1250 06/07/24 1259 06/08/24 0602  WBC 12.3*  --  10.6*  NEUTROABS 8.8*  --   --   HGB 16.2 16.3 14.8  HCT 48.3 48.0 44.1  MCV 86.3  --  86.3  PLT 239  --  185    Basic Metabolic Panel:  Lab Results  Component Value Date   NA 132 (L) 06/08/2024   K 4.4 06/08/2024   CO2 23 06/08/2024   GLUCOSE 162 (H) 06/08/2024   BUN 23 06/08/2024   CREATININE 1.08 06/08/2024   CALCIUM  8.0 (L) 06/08/2024   GFRNONAA >60 06/08/2024   Urine Drug Screen:     Component Value Date/Time   LABOPIA POSITIVE (A) 06/07/2024 1600   COCAINSCRNUR NONE DETECTED 06/07/2024 1600   LABBENZ NONE DETECTED 06/07/2024 1600   AMPHETMU NONE DETECTED 06/07/2024 1600   THCU NONE DETECTED 06/07/2024 1600   LABBARB NONE DETECTED 06/07/2024 1600    Alcohol Level     Component Value Date/Time   [facility] <15 06/07/2024 1255   INR  Lab Results  Component Value Date   INR 1.0 06/07/2024   APTT  Lab Results  Component Value Date   APTT 28 06/07/2024   CT Head without contrast(Personally reviewed): Age-related atrophy and mild to moderate cerebral white matter disease. ASPECTS is 10.   CT angio Head and Neck with contrast(Personally reviewed): No LVO   Repeat CT Head without contrast (Personally reviewed): No evidence of an acute intracranial abnormality. Moderate chronic small vessel ischemic changes within the cerebral white matter. Generalized cerebral atrophy.   B12 level-253.  Assessment   Charles Huynh is a 63 y.o. male  hx of a fib on Eliquis  (unknown compliance), prior strokes, clotting disorder, DM, HTN, HLD, CAD, anxiety and depression, GERD, HX of PE, hypothyroidism, dysphagia, parkinsons disease, BPH who presents to [facility] ED via EMS as a code stroke for acute onset of left arm weakness and  facial droop CT/CTA head with no acute process, no LVO.  Outside the window for TNK.  No ELVO to pursue EVT.Unable to get MRI due to spinal cord stimulator/implant. Repeat CTH negative for acute issue.   Altered on exam with features of parkinsonism on arrival exam.  Hypotensive in the ED, along with metabolic derangements such as deranged renal function.  Much improved this morning.  Likely that polypharmacy combined with dehydration, low B12 from neuro standpoint contributed to altered mental status presentation. Also on opiates.  Might have been contributing from polypharmacy contributing to altered mental status alteration.  Also has low B12 from a neurological standard.  Replete for a level greater than 400.   Recommendations   - OK from neuro standpoint to continue resumed anticoagulation - Continue home Keppra for  his history of seizures - Continue high intensity statin for his stroke prevention and CAD. - Continue B12 supplementation on discharge  - Educated on judicious use of opiates at home - Educated on effects of dehydration on Parkinson's patients   Patient is OK for discharge from neurology standpoint, with recommendations as above. Follow-up with established outpatient neurology (Dr. Scot with [facility]).   _____________________________________________________________   Charles Rocky JAYSON Judithe, NP [facility]    Attending Neurohospitalist Addendum Patient seen and examined with APP/Resident. Agree with the history and physical as documented above. Agree with the plan as documented, which I helped formulate. I have independently reviewed the chart, obtained history, review of systems and examined the patient.I have personally reviewed pertinent head/neck/spine imaging (CT/MRI). Please feel free to call with any questions.  -- Eligio Lav, MD Neurologist [facility] Pager: [phone]

## 2024-06-09 NOTE — Discharge Summary (Signed)
 Physician Discharge Summary   Patient: Charles Huynh MRN: [id number] DOB: Apr 20, 1961  Admit date:     06/07/2024  Discharge date: 06/09/24  Discharge Physician: Owen DELENA Lore   PCP: Claudene Darice HERO, MD   Recommendations at discharge:    Needs to follow up with PCP for chronic medical problems.  Needs repeat B 12 level in 4 weeks.  Needs follow up with cardiology   Discharge Diagnoses: Principal Problem:   Weakness Active Problems:   Essential hypertension   PAF (paroxysmal atrial fibrillation) (HCC)   Hypothyroidism   Anxiety   Type 2 diabetes mellitus without complication (HCC)   Hx pulmonary embolism   Hyperlipidemia   Hypertension   Parkinson's disease (HCC)   Chronic pain syndrome   Gastroesophageal reflux disease without esophagitis   History of CVA (cerebrovascular accident)   Benign prostatic hyperplasia with urinary obstruction   Mild episode of recurrent major depressive disorder (HCC)   Coronary artery disease of native artery of native heart with stable angina pectoris (HCC)   Complex partial seizure disorder (HCC)   Acute encephalopathy   AKI (acute kidney injury) (HCC)   Pulmonary emphysema (HCC)  Resolved Problems:   * No resolved hospital problems. *  Hospital Course: 63 -year-old with past medical history significant for hypertension, hyperlipidemia, CVA, hypothyroidism, paroxysmal A-fib, diabetes, CAD, CAD, BPH, depression, anxiety, Parkinson's disease, seizure, chronic bronchitis, PE, chronic pain syndrome status post spinal stimulator presents with weakness.  Patient presented with 2 or 3 days of weakness generalized not feeling well also noted to have left-sided facial droop and left greater than right-sided weakness.  Outpatient became less responsive as well.  He had some hypotension which resolved with IV fluids in the ED.   CT head showed no acute abnormality. CTA head and neck with perfusion showed mild right carotid atherosclerosis but  otherwise no acute abnormality.    Assessment and Plan: 1-Acute Metabolic encephalopathy - Presents with weakness, EMS had concern for left facial droop and left greater than right weakness - CT head and neck and angiogram negative for acute abnormalities - Unable to perform MRI due to stimulator.  Plan to proceed with CT head in 24 hours. - Workup for an infectious process: UA negative, follow Blood cultures.  Chest x-ray no acute findings TSH: 4.1, ammonia less than 13, B12 253.  Will start supplementation, thiamine pending  back to baseline. Feels better. Suspect component of dehydration.   AKI:  Patient with a creatinine of 1.5 Suspect hypovolemia dehydration Continue with IV fluids Fattening down to 1.0     Hypotension; Responded to  IV fluids Held hydrochlorothiazide  and lisinopril Infectious work up negative.    Lactic acidosis - Failure related to hypoperfusion and dehydration.  Resolved with IV fluids   Hypertension - Hold hydrochlorothiazide  and will resume  lisinopril   Hyperlipidemia Continue rosuvastatin    CVA; Continue rosuvastatin    Seizure -Continue with  Keppra   Hypothyroidism: - Continue with Synthroid   Paroxysmal A-fib - Continue with metoprolol  -Eliquis  resume/ .    Diabetes: -resume home regimen    GERD:  -Continue with Protonix   CAD: Continue statin  and Plavix  Recent stenting in March at [facility] with plan for 12 months of Plavix in addition to Eliquis . (As above, patient thought he was supposed to stop Eliquis )   discussed with patient. He is aware needs eliquis  and Plavix  Emphysema: Continue Anoro Ellipta   Depression, Anxiety:  - Continue Lyrica and Celexa  Parkinson Diseases:  - Continue Sinemet   History of PE.  Resume Eliquis /    Chronic Pain.  Continue as needed oxycodone         Consultants: Neurology  Disposition: Home Diet recommendation:  Cardiac diet DISCHARGE MEDICATION: Allergies as of 06/09/2024        Reactions   Naproxen Sodium Shortness Of Breath, Anaphylaxis   Betadine [povidone Iodine] Rash        Medication List     STOP taking these medications    D3-50 1.25 MG (50000 UT) capsule Generic drug: Cholecalciferol   Dilt-XR 120 MG 24 hr capsule Generic drug: diltiazem   hydrochlorothiazide  12.5 MG capsule Commonly known as: MICROZIDE    metoCLOPramide  10 MG tablet Commonly known as: REGLAN    potassium chloride  10 MEQ tablet Commonly known as: KLOR-CON    promethazine 25 MG tablet Commonly known as: PHENERGAN       TAKE these medications    albuterol 108 (90 Base) MCG/ACT inhaler Commonly known as: VENTOLIN HFA Inhale 2 puffs into the lungs every 4 (four) hours as needed for wheezing or shortness of breath.   Anoro Ellipta 62.5-25 MCG/ACT Aepb Generic drug: umeclidinium-vilanterol Inhale 1 puff into the lungs daily.   apixaban  5 MG Tabs tablet Commonly known as: ELIQUIS  Take 1 tablet (5 mg total) by mouth 2 (two) times daily.   BIOFREEZE EX Apply 1 application topically daily as needed (shoulder pain).   bismuth subsalicylate 262 MG/15ML suspension Commonly known as: PEPTO BISMOL Take 30 mLs by mouth every 6 (six) hours as needed for indigestion.   carbidopa-levodopa 50-200 MG tablet Commonly known as: SINEMET CR Take 1 tablet by mouth in the morning, at noon, in the evening, and at bedtime.   citalopram 20 MG tablet Commonly known as: CELEXA Take 20 mg by mouth in the morning.   CLEAR EYES OP Place 1 drop into both eyes daily as needed (itchy eyes).   clopidogrel 75 MG tablet Commonly known as: PLAVIX Take 75 mg by mouth in the morning.   cyanocobalamin 1000 MCG tablet Take 1 tablet (1,000 mcg total) by mouth daily. Start taking on: June 10, 2024   diphenhydrAMINE  25 MG tablet Commonly known as: BENADRYL  Take 25 mg by mouth daily as needed for allergies.   esomeprazole 40 MG capsule Commonly known as: NEXIUM Take 40 mg by mouth  at bedtime.   fentaNYL  25 MCG/HR Commonly known as: DURAGESIC  Place 1 patch onto the skin as directed. Apply every 2.5 days   hydrocortisone cream 1 % Apply 1 application topically 2 (two) times daily as needed for itching.   Jardiance 25 MG Tabs tablet Generic drug: empagliflozin Take 25 mg by mouth in the morning.   levETIRAcetam 500 MG tablet Commonly known as: KEPPRA Take 500 mg by mouth 2 (two) times daily.   levothyroxine 75 MCG tablet Commonly known as: SYNTHROID Take 1 tablet (75 mcg total) by mouth daily.   lisinopril 5 MG tablet Commonly known as: ZESTRIL Take 5 mg by mouth in the morning.   metFORMIN 500 MG 24 hr tablet Commonly known as: GLUCOPHAGE-XR Take 1,000 mg by mouth in the morning and at bedtime.   metoprolol  tartrate 100 MG tablet Commonly known as: LOPRESSOR  Take 1 tablet (100 mg total) by mouth 2 (two) times daily.   Narcan 4 MG/0.1ML Liqd nasal spray kit Generic drug: naloxone Place 0.4 mg into the nose once.   nicotine 14 mg/24hr patch Commonly known as: NICODERM CQ - dosed in  mg/24 hours Place 1 patch (14 mg total) onto the skin daily. Start taking on: June 10, 2024   nitroGLYCERIN  0.4 MG SL tablet Commonly known as: NITROSTAT  DISSOLVE 1 TABLET UNDER THE TONGUE EVERY 5 MINUTES AS NEEDED FOR CHEST PAIN What changed: See the new instructions.   oxyCODONE 15 MG immediate release tablet Commonly known as: ROXICODONE Take 15 mg by mouth 5 (five) times daily as needed for pain.   pregabalin 300 MG capsule Commonly known as: LYRICA Take 300 mg by mouth 2 (two) times daily.   rosuvastatin  20 MG tablet Commonly known as: CRESTOR  Take 1 tablet (20 mg total) by mouth daily. What changed: when to take this   Symproic 0.2 MG Tabs Generic drug: Naldemedine Tosylate Take 0.2 mg by mouth daily as needed (constipation).   testosterone cypionate 200 MG/ML injection Commonly known as: DEPOTESTOSTERONE CYPIONATE Inject 200 mg into the muscle  every 14 (fourteen) days.   Tresiba FlexTouch 100 UNIT/ML FlexTouch Pen Generic drug: insulin degludec Inject 22 Units into the skin at bedtime.        Discharge Exam: Filed Weights   06/07/24 1200  Weight: 85 kg   General; NAD  Condition at discharge: stable  The results of significant diagnostics from this hospitalization (including imaging, microbiology, ancillary and laboratory) are listed below for reference.   Imaging Studies: CT HEAD WO CONTRAST ( ) Result Date: 06/08/2024 CLINICAL DATA:  Provided history: Stroke, follow-up. EXAM: CT HEAD WITHOUT CONTRAST TECHNIQUE: Contiguous axial images were obtained from the base of the skull through the vertex without intravenous contrast. RADIATION DOSE REDUCTION: This exam was performed according to the departmental dose-optimization program which includes automated exposure control, adjustment of the mA and/or kV according to patient size and/or use of iterative reconstruction technique. COMPARISON:  Non-contrast head CT and CT angiogram head/neck 06/07/2024. FINDINGS: Brain: Generalized cerebral atrophy. Patchy and ill-defined hypoattenuation within the cerebral white matter, nonspecific but compatible with moderate chronic small vessel ischemic disease. There is no acute intracranial hemorrhage. No demarcated cortical infarct. No extra-axial fluid collection. No evidence of an intracranial mass. No midline shift. Vascular: No hyperdense vessel.  Atherosclerotic calcifications. Skull: No calvarial fracture or aggressive osseous lesion. Sinuses/Orbits: No mass or acute finding within the imaged orbits. No significant paranasal sinus disease at the imaged levels. IMPRESSION: 1.  No evidence of an acute intracranial abnormality. 2. Moderate chronic small vessel ischemic changes within the cerebral white matter. 3. Generalized cerebral atrophy. Electronically Signed   By: Rockey Childs D.O.   On: 06/08/2024 15:00   DG CHEST PORT 1 VIEW Result  Date: 06/07/2024 CLINICAL DATA:  Altered mental status, facial droop EXAM: PORTABLE CHEST 1 VIEW COMPARISON:  03/18/2024 FINDINGS: Stable appearance of cervical plate and screw fixator and left upper abdominal pulse generator with lead electrodes projecting over the midline lower neck. The lungs appear clear. Cardiac and mediastinal contours normal. No blunting of the costophrenic angles. No significant bony findings. IMPRESSION: 1. No acute findings. 2. Cervical plate and screw fixator and left upper abdominal pulse generator with lead electrodes projecting over the midline lower neck. Electronically Signed   By: Ryan Salvage M.D.   On: 06/07/2024 14:38   CT ANGIO HEAD NECK W WO CM W PERF (CODE STROKE) Result Date: 06/07/2024 CLINICAL DATA:  Acute neurological deficit. EXAM: CT HEAD WITHOUT CONTRAST CT ANGIOGRAPHY OF THE HEAD AND NECK CT PERFUSION BRAIN TECHNIQUE: Contiguous axial images were obtained from the base of the skull through the vertex without intravenous contrast.  Multidetector CT imaging of the head and neck was performed using the standard protocol during bolus administration of intravenous contrast. Multiplanar CT image reconstructions and MIPs were obtained to evaluate the vascular anatomy. Carotid stenosis measurements (when applicable) are obtained utilizing NASCET criteria, using the distal internal carotid diameter as the denominator. Multiphase CT imaging of the brain was performed following IV bolus contrast injection. Subsequent parametric perfusion maps were calculated using RAPID software. RADIATION DOSE REDUCTION: This exam was performed according to the departmental dose-optimization program which includes automated exposure control, adjustment of the mA and/or kV according to patient size and/or use of iterative reconstruction technique. CONTRAST:  75mL OMNIPAQUE  IOHEXOL  350 MG/ML SOLN COMPARISON:  CT of the head dated January 23, 2022. FINDINGS: CT HEAD Brain: Cerebral atrophy  and mild to moderate cerebral white matter disease. Vascular: No acute process. Skull: Intact. Sinuses/Orbits: Unremarkable. CTA NECK Aortic arch: Mild calcific atheromatous disease. No evidence of aneurysm or stenosis. Right carotid system: Mild to moderate calcific plaque within the carotid bulb and origin of the internal carotid artery. Mild stenosis of the proximal cervical segment of the internal carotid artery, proximally 20%. Left carotid system: Mild calcific plaque at the origin of the internal carotid artery, which is tortuous, but normal in caliber. Mild atheromatous disease within the common carotid artery without significant stenosis. Vertebral arteries:Left vertebral artery is dominant. Both arteries are normal in caliber throughout the respective courses. Skeleton: Status post ACDF at C5-6. Posterior spinal cord stimulator device is present. Other neck: None. CTA HEAD Anterior circulation: Normal. No evidence of aneurysm, large vessel occlusion or flow-limiting stenosis. Posterior circulation: Normal. Fetal type origin of the right posterior cerebral artery with diminutive right P1 segment. Venous sinuses: Patent. Anatomic variants: As above. Delayed phase: No abnormal parenchymal or meningeal enhancement. CT Brain Perfusion Findings: ASPECTS: 10. CBF (<30%) Volume: 0mL Perfusion (Tmax>6.0s) volume: 0mL Mismatch Volume: 0mL Infarction Location:Not applicable. IMPRESSION: 1. Mild calcific atheromatous disease within the right internal carotid artery with mild stenosis of the lower cervical segment, proximally 20%. 2. Negative CT angiogram of the head. 3. Normal CT perfusion study. These results were communicated at the time of interpretation on 06/07/2024 at 1:30 pm to Dr. ELIGIO LAV . Electronically Signed   By: Evalene Coho M.D.   On: 06/07/2024 13:38   CT HEAD CODE STROKE WO CONTRAST Result Date: 06/07/2024 CLINICAL DATA:  Code stroke. EXAM: CT HEAD WITHOUT CONTRAST TECHNIQUE: Contiguous  axial images were obtained from the base of the skull through the vertex without intravenous contrast. RADIATION DOSE REDUCTION: This exam was performed according to the departmental dose-optimization program which includes automated exposure control, adjustment of the mA and/or kV according to patient size and/or use of iterative reconstruction technique. COMPARISON:  CT of the head dated June 24, 2023. FINDINGS: Brain: Moderate generalized cerebral and cerebellar volume loss and moderate periventricular and deep cerebral white matter disease. No evidence of hemorrhage, mass, acute cortical infarct or hydrocephalus. Vascular: No hyperdense vessel. Moderate calcific atheromatous disease within the carotid siphons. Skull: Intact and unremarkable. Sinuses/Orbits: Minimal mucosal disease within the right maxillary sinus. The orbits are unremarkable. The mastoid air cells are clear. Other: None. ASPECTS [facility] Stroke Program Early CT Score) - Ganglionic level infarction (caudate, lentiform nuclei, internal capsule, insula, M1-M3 cortex): 7. - Supraganglionic infarction (M4-M6 cortex): 3. Total score (0-10 with 10 being normal): 10. IMPRESSION: 1. Age-related atrophy and mild to moderate cerebral white matter disease. 2. ASPECTS is 10. 3. These results were called by telephone  at the time of interpretation on 06/07/2024 at 1:06 pm to provider Dr. Voncile, who verbally acknowledged these results. Electronically Signed   By: Evalene Coho M.D.   On: 06/07/2024 13:18    Microbiology: Results for orders placed or performed during the hospital encounter of 06/07/24  Culture, blood (Routine X 2) w Reflex to ID Panel     Status: None (Preliminary result)   Collection Time: 06/07/24  1:50 PM   Specimen: BLOOD RIGHT HAND  Result Value Ref Range Status   Specimen Description BLOOD RIGHT HAND  Final   Special Requests   Final    BOTTLES DRAWN AEROBIC AND ANAEROBIC Blood Culture results may not be optimal due to an  inadequate volume of blood received in culture bottles   Culture   Final    NO GROWTH 2 DAYS Performed at [facility], [address] [place], [place] [address]    Report Status PENDING  Incomplete  Culture, blood (Routine X 2) w Reflex to ID Panel     Status: None (Preliminary result)   Collection Time: 06/07/24  4:30 PM   Specimen: BLOOD  Result Value Ref Range Status   Specimen Description BLOOD RIGHT ANTECUBITAL  Final   Special Requests   Final    BOTTLES DRAWN AEROBIC AND ANAEROBIC Blood Culture adequate volume   Culture   Final    NO GROWTH 2 DAYS Performed at [facility], [address], [place], [place] [address]    Report Status PENDING  Incomplete    Labs: CBC: Recent Labs  Lab 06/07/24 1250 06/07/24 1259 06/08/24 0602  WBC 12.3*  --  10.6*  NEUTROABS 8.8*  --   --   HGB 16.2 16.3 14.8  HCT 48.3 48.0 44.1  MCV 86.3  --  86.3  PLT 239  --  185   Basic Metabolic Panel: Recent Labs  Lab 06/07/24 1259 06/07/24 1350 06/08/24 0902  NA 133* 132* 132*  K 6.8* 5.1 4.4  CL 101 101 103  CO2  --  22 23  GLUCOSE 216* 209* 162*  BUN 54* 34* 23  CREATININE 1.60* 1.56* 1.08  CALCIUM   --  8.5* 8.0*   Liver Function Tests: Recent Labs  Lab 06/07/24 1350 06/08/24 0902  AST 12* 15  ALT 5 8  ALKPHOS 64 64  BILITOT 0.7 0.9  PROT 5.5* 5.1*  ALBUMIN 3.1* 2.8*   CBG: Recent Labs  Lab 06/08/24 1115 06/08/24 1625 06/08/24 2105 06/09/24 0603 06/09/24 0836  GLUCAP 214* 292* 232* 231* 236*    Discharge time spent: greater than 30 minutes.  Signed: Owen DELENA Lore, MD [facility] 06/09/2024

## 2024-06-09 NOTE — Evaluation (Signed)
 Occupational Therapy Evaluation Patient Details Name: Charles Huynh MRN: [id number] DOB: September 19, 1961 Today's Date: 06/09/2024   History of Present Illness   63 y.o. male presents to [facility] 06/07/24 after 2-3 days of weakness with L sided facial droop, L>R sided weakness, and AMS upon EMS arrival. Head CT negative. Admitted with acute metabolic encephalopathy. PMHx:  hypertension, hyperlipidemia, CVA, hypothyroidism, paroxysmal A-fib, diabetes, GERD, CAD, BPH, depression, anxiety, Parkinson's disease, seizures, chronic bronchitis, PE, chronic pain status post spinal stimulator     Clinical Impressions PTA patient independent with mobility using RW recently due to weakness and endorses several falls, needing some assist for LB Adls but managing as best he can.  Admitted for above and presents with problem list below.  He requires min assist for transfers and mobility using RW,  setup to mod assist for ADLs.  Spouse and patient report son can provide increased hands on support during transfers, mobility and ADLs to optimize safety.  Based on performance today, pt will best benefit from continued OT services acutely and after dc at [facility] level to optimize independence and safety with ADLs, IADLs and mobility.      If plan is discharge home, recommend the following:   A little help with walking and/or transfers;A lot of help with bathing/dressing/bathroom;Assistance with cooking/housework;Direct supervision/assist for medications management;Direct supervision/assist for financial management;Assist for transportation;Help with stairs or ramp for entrance     Functional Status Assessment   Patient has had a recent decline in their functional status and demonstrates the ability to make significant improvements in function in a reasonable and predictable amount of time.     Equipment Recommendations   BSC/3in1     Recommendations for Other Services         Precautions/Restrictions    Precautions Precautions: Fall Restrictions Weight Bearing Restrictions Per Provider Order: No     Mobility Bed Mobility Overal bed mobility: Needs Assistance Bed Mobility: Supine to Sit     Supine to sit: Supervision, HOB elevated          Transfers Overall transfer level: Needs assistance Equipment used: Rolling walker (2 wheels) Transfers: Sit to/from Stand Sit to Stand: Min assist           General transfer comment: MinA for boost-up, cues for hand placement with pt tending to reach for B UE on handles      Balance Overall balance assessment: Needs assistance, Mild deficits observed, not formally tested, History of Falls Sitting-balance support: No upper extremity supported, Feet supported Sitting balance-Leahy Scale: Good     Standing balance support: No upper extremity supported Standing balance-Leahy Scale: Fair Standing balance comment: able to stand statically at sink, RW for gait                           ADL either performed or assessed with clinical judgement   ADL Overall ADL's : Needs assistance/impaired     Grooming: Contact guard assist;Standing;Wash/dry hands           Upper Body Dressing : Set up;Sitting   Lower Body Dressing: Moderate assistance;Sit to/from stand   Toilet Transfer: Ambulation;Minimal assistance;Rolling walker (2 wheels)           Functional mobility during ADLs: Minimal assistance;Rolling walker (2 wheels);Cueing for safety;Cueing for sequencing       Vision   Vision Assessment?: No apparent visual deficits Additional Comments: not formally assessed     Perception  Praxis         Pertinent Vitals/Pain Pain Assessment Pain Assessment: Faces Faces Pain Scale: Hurts little more Pain Location: R>L hip with MMT Pain Descriptors / Indicators: Aching, Discomfort Pain Intervention(s): Limited activity within patient's tolerance, Monitored during session, Repositioned      Extremity/Trunk Assessment Upper Extremity Assessment Upper Extremity Assessment: Generalized weakness;Right hand dominant (tremors: resting R UE, intentional L UE; baseline neuropathy in hands)   Lower Extremity Assessment Lower Extremity Assessment: Defer to PT evaluation RLE Deficits / Details: Hip flexion 4+/5, knee ext 4+/5, ankle DF 5/5. RLE Sensation: decreased light touch;history of peripheral neuropathy LLE Deficits / Details: Hip flexion 4+/5, knee ext 4/5, ankle DF 5/5 LLE Sensation: decreased light touch;history of peripheral neuropathy   Cervical / Trunk Assessment Cervical / Trunk Assessment: Normal   Communication Communication Communication: No apparent difficulties   Cognition Arousal: Alert Behavior During Therapy: WFL for tasks assessed/performed Cognition: Cognition impaired     Awareness: Intellectual awareness intact, Online awareness impaired Memory impairment (select all impairments): Short-term memory, Working memory Attention impairment (select first level of impairment): Sustained attention Executive functioning impairment (select all impairments): Sequencing, Reasoning, Problem solving OT - Cognition Comments: cueing for safety, probelm solving.  Poor awareness.                 Following commands: Intact       Cueing  General Comments   Cueing Techniques: Verbal cues  wife present and supportive   Exercises     Shoulder Instructions      Home Living Family/patient expects to be discharged to:: Private residence Living Arrangements: Spouse/significant other Available Help at Discharge: Family;Available 24 hours/day (slight physical assist) Type of Home: Mobile home Home Access: Stairs to enter Entrance Stairs-Number of Steps: 4 Entrance Stairs-Rails: Right;Left Home Layout: One level     Bathroom Shower/Tub: Chief Strategy Officer: Handicapped height (1 HH, 1 standard) Bathroom Accessibility: No   Home  Equipment: Pharmacist, hospital (2 wheels);Rollator (4 wheels);Cane - single point (shower chair too wide for shower)   Additional Comments: son is available to assist as well      Prior Functioning/Environment Prior Level of Function : Needs assist;History of Falls (last six months)             Mobility Comments: ModI with RW. Multiple falls, almost every day due to seizures. Drops straight to the ground ADLs Comments: Wife drives and does cooking/cleaning/med mgmt. Difficulty showering with wife helping as able. Helps wife sometimes with physical activities. pt manages ADLs as best he can, needs assist for socks.    OT Problem List: Decreased strength;Decreased activity tolerance;Impaired balance (sitting and/or standing);Decreased coordination;Decreased cognition;Decreased safety awareness;Decreased knowledge of use of DME or AE;Decreased knowledge of precautions;Obesity;Impaired sensation   OT Treatment/Interventions: Self-care/ADL training;Therapeutic exercise;DME and/or AE instruction;Energy conservation;Therapeutic activities;Patient/family education;Balance training;Cognitive remediation/compensation      OT Goals(Current goals can be found in the care plan section)   Acute Rehab OT Goals Patient Stated Goal: home OT Goal Formulation: With patient Time For Goal Achievement: 06/23/24 Potential to Achieve Goals: Good   OT Frequency:  Min 2X/week    Co-evaluation PT/OT/SLP Co-Evaluation/Treatment: Yes Reason for Co-Treatment: For patient/therapist safety;To address functional/ADL transfers PT goals addressed during session: Mobility/safety with mobility;Balance;Proper use of DME OT goals addressed during session: ADL's and self-care      AM-PAC OT 6 Clicks Daily Activity     Outcome Measure Help from another person eating meals?: A Little Help from  another person taking care of personal grooming?: A Little Help from another person toileting, which includes  using toliet, bedpan, or urinal?: A Little Help from another person bathing (including washing, rinsing, drying)?: A Lot Help from another person to put on and taking off regular upper body clothing?: A Little Help from another person to put on and taking off regular lower body clothing?: A Lot 6 Click Score: 16   End of Session Equipment Utilized During Treatment: Gait belt;Rolling walker (2 wheels) Nurse Communication: Mobility status  Activity Tolerance: Patient tolerated treatment well Patient left: with call bell/phone within reach;Other (comment) (sitting EOB)  OT Visit Diagnosis: Other abnormalities of gait and mobility (R26.89);Muscle weakness (generalized) (M62.81);History of falling (Z91.81);Other symptoms and signs involving cognitive function                Time: 9065-8994 OT Time Calculation (min): 31 min Charges:  OT General Charges $OT Visit: 1 Visit OT Evaluation $OT Eval Moderate Complexity: 1 Mod  Etta NOVAK, OT Acute Rehabilitation Services Office [phone] Secure Chat Preferred    Etta GORMAN Hope 06/09/2024, 10:44 AM

## 2024-06-09 NOTE — Progress Notes (Signed)
 Discharge instructions, RX's and follow up appts explained by [facility] nurse, provided copy to patient and c/g verbalized understanding. DME delivered to room and sent with patient. Left floor via wheelchair accompanied by staff. No c/o pain or shortness of breath at d/c.  Artur Winningham, Cena Helling, RN

## 2024-06-09 NOTE — TOC Initial Note (Signed)
 Transition of Care (TOC) - Initial/Assessment Note   Spoke to patient at bedside with therapy. Therapy recommending 3 in1 and HHPT/OT   Patient and wife in agreement. Asked MD to sign orders and note for DME.   Burnard with [facility] accepted referral  Ordered 3 in1 with Jermaine with [employer]  NCM was then told by nurse they want OP PT. NCM confirmed with patient and wife. Patient states his MD has already arranged OP PT and he does not want HH, but does want the [facility]  Burnard with [facility] updated  Patient Details  Name: Charles Huynh MRN: [id number] Date of Birth: 12-Jun-1961  Transition of Care [facility]) CM/SW Contact:    Stephane Powell Jansky, RN Phone Number: 06/09/2024, 10:26 AM  Clinical Narrative:                   Expected Discharge Plan: Home w Home Health Services Barriers to Discharge: No Barriers Identified   Patient Goals and CMS Choice Patient states their goals for this hospitalization and ongoing recovery are:: to return to home CMS Medicare.gov Compare Post Acute Care list provided to:: Patient Choice offered to / list presented to : Patient      Expected Discharge Plan and Services   Discharge Planning Services: CM Consult Post Acute Care Choice: Home Health, Durable Medical Equipment Living arrangements for the past 2 months: Single Family Home Expected Discharge Date: 06/09/24               DME Arranged: 3-N-1 DME Agency: [employer] Date DME Agency Contacted: 06/09/24 Time DME Agency Contacted: 1025 Representative spoke with at DME Agency: [place] HH Arranged: OT, PT HH Agency: [facility] Date [facility] Agency Contacted: 06/09/24 Time HH Agency Contacted: 1025 Representative spoke with at [facility] Agency: kelly  Prior Living Arrangements/Services Living arrangements for the past 2 months: Single Family Home Lives with:: Spouse Patient language and need for interpreter reviewed:: Yes Do you feel safe going back to the place where you  live?: Yes      Need for Family Participation in Patient Care: Yes (Comment) Care giver support system in place?: Yes (comment)   Criminal Activity/Legal Involvement Pertinent to Current Situation/Hospitalization: No - Comment as needed  Activities of Daily Living      Permission Sought/Granted   Permission granted to share information with : Yes, Verbal Permission Granted  Share Information with NAME: Starlett wife  Permission granted to share info w AGENCY: [facility] and [facility]        Emotional Assessment Appearance:: Appears stated age Attitude/Demeanor/Rapport: Engaged Affect (typically observed): Appropriate Orientation: : Oriented to Self, Oriented to Place, Oriented to  Time, Oriented to Situation Alcohol / Substance Use: Not Applicable Psych Involvement: No (comment)  Admission diagnosis:  Weakness [R53.1] Disorientation [R41.0] AKI (acute kidney injury) (HCC) [N17.9] Patient Active Problem List   Diagnosis Date Noted   AKI (acute kidney injury) (HCC) 06/07/2024   Weakness 06/07/2024   Pulmonary emphysema (HCC) 10/08/2023   Complex partial seizure disorder (HCC) 10/21/2022   Acute encephalopathy 03/13/2022   Coronary artery disease of native artery of native heart with stable angina pectoris (HCC) 02/27/2022   Chronic pain of both shoulders 04/08/2021   Hypogonadism in male 03/11/2021   Dysphagia 03/11/2021   Fatigue 03/11/2021   Intermittent palpitations 03/11/2021   Nicotine dependence 03/11/2021   Wound of skin 03/11/2021   History of CVA (cerebrovascular accident)    History of ulcer disease    Clotting disorder (HCC)  Barrett esophagus    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 02/06/2021   Gastroparesis 01/24/2021   Mild episode of recurrent major depressive disorder (HCC) 01/24/2021   Anxiety    Heart disease    Hx pulmonary embolism    Hyperlipidemia    Hypertension    PAF (paroxysmal atrial fibrillation) (HCC) 01/03/2020   Essential hypertension  08/30/2019   Gastroesophageal reflux disease without esophagitis 09/02/2017   Hypothyroidism 07/09/2017   Type 2 diabetes mellitus without complication (HCC) 07/09/2017   Parkinson's disease (HCC) 07/09/2017   Chronic pain syndrome 07/09/2017   Benign prostatic hyperplasia with urinary obstruction 12/28/2015   PCP:  Claudene Darice HERO, MD Pharmacy:   [id number] - [place], [place] - [address] [place] [address] Phone: [phone] Fax: [phone]  [id number], [place] - [address] [place] [address] Phone: [phone] Fax: [phone]  [facility] [place] [facility] [address] [place] [address] Phone: [phone] Fax: [phone]     Social Drivers of Health (SDOH) Social History: SDOH Screenings   Food Insecurity: Low Risk  (03/01/2024)   Received from [facility]  Housing: Low Risk  (03/01/2024)   Received from [facility]  Transportation Needs: No Transportation Needs (03/01/2024)   Received from [facility]  Utilities: Low Risk  (03/01/2024)   Received from [facility]  Tobacco Use: High Risk (06/07/2024)   SDOH Interventions:     Readmission Risk Interventions     No data to display

## 2024-06-09 NOTE — Evaluation (Signed)
 Physical Therapy Evaluation Patient Details Name: Charles Huynh MRN: [id number] DOB: [birth year] Today's Date: 06/09/2024  History of Present Illness  63 y.o. male presents to [facility] 06/07/24 after 2-3 days of weakness with L sided facial droop, L>R sided weakness, and AMS upon EMS arrival. Head CT negative. Admitted with acute metabolic encephalopathy. PMHx:  hypertension, hyperlipidemia, CVA, hypothyroidism, paroxysmal A-fib, diabetes, GERD, CAD, BPH, depression, anxiety, Parkinson's disease, seizures, chronic bronchitis, PE, chronic pain status post spinal stimulator   Clinical Impression  Pt in bed upon arrival with wife present and agreeable to PT eval. PTA, pt was ModI with no AD or RW. Pt reports multiple falls in the past 6 months due to seizures. In today's session, pt was supervision for bed mobility and MinA to stand with RW. Pt was then able to ambulate 142ft with RW and CGA. Practiced stair negotiation with portable step with pt able to ascend/descend 3 steps with CGA and sideways step pattern. Pt and wife reported that their son can be at home 24/7 and offer physical assistance. Recommending HHPT to work towards independence with mobility and prevent future falls. Pt would benefit from acute skilled PT with current functional limitations listed below (see PT Problem List). Acute PT to follow.         If plan is discharge home, recommend the following: A lot of help with walking and/or transfers;A lot of help with bathing/dressing/bathroom;Assistance with cooking/housework;Assist for transportation;Direct supervision/assist for financial management;Direct supervision/assist for medications management;Help with stairs or ramp for entrance   Can travel by private vehicle    Yes    Equipment Recommendations BSC/3in1     Functional Status Assessment Patient has had a recent decline in their functional status and demonstrates the ability to make significant improvements in function in a  reasonable and predictable amount of time.     Precautions / Restrictions Precautions Precautions: Fall Restrictions Weight Bearing Restrictions Per Provider Order: No      Mobility  Bed Mobility Overal bed mobility: Needs Assistance Bed Mobility: Supine to Sit    Supine to sit: Supervision, HOB elevated     Transfers Overall transfer level: Needs assistance Equipment used: Rolling walker (2 wheels) Transfers: Sit to/from Stand Sit to Stand: Min assist    General transfer comment: MinA for boost-up, cues for hand placement with pt tending to reach for B UE on handles    Ambulation/Gait Ambulation/Gait assistance: Contact guard assist Gait Distance (Feet): 120 Feet Assistive device: Rolling walker (2 wheels) Gait Pattern/deviations: Step-through pattern, Decreased stride length, Shuffle Gait velocity: decr    General Gait Details: slow and shuffling steps, steady with no overt LOB  Stairs Stairs: Yes Stairs assistance: Contact guard assist Stair Management: One rail Left, Sideways, Step to pattern Number of Stairs: 3 General stair comments: used portable step with BUE on handrail. Sideways pattern with CGA for safety     Balance Overall balance assessment: Needs assistance, Mild deficits observed, not formally tested, History of Falls Sitting-balance support: No upper extremity supported, Feet supported Sitting balance-Leahy Scale: Good     Standing balance support: No upper extremity supported Standing balance-Leahy Scale: Fair Standing balance comment: able to stand statically at sink, RW for gait        Pertinent Vitals/Pain Pain Assessment Pain Assessment: Faces Faces Pain Scale: Hurts little more Pain Location: R>L hip with MMT Pain Descriptors / Indicators: Aching, Discomfort Pain Intervention(s): Limited activity within patient's tolerance, Monitored during session, Repositioned    Home Living Family/patient expects  to be discharged to:: Private  residence Living Arrangements: Spouse/significant other Available Help at Discharge: Family;Available 24 hours/day (slight physical assist) Type of Home: Mobile home Home Access: Stairs to enter Entrance Stairs-Rails: Right;Left Entrance Stairs-Number of Steps: 4   Home Layout: One level Home Equipment: Pharmacist, hospital (2 wheels);Rollator (4 wheels);Cane - single point (shower chair too wide for shower)      Prior Function Prior Level of Function : Needs assist;History of Falls (last six months)    Mobility Comments: ModI with RW. Multiple falls, almost every day due to seizures. Drops straight to the ground ADLs Comments: Wife drives and does cooking/cleaning. Difficulty showering with wife helping as able. Helps wife sometimes with physical activities     Extremity/Trunk Assessment   Upper Extremity Assessment Upper Extremity Assessment: Defer to OT evaluation    Lower Extremity Assessment Lower Extremity Assessment: RLE deficits/detail;LLE deficits/detail RLE Deficits / Details: Hip flexion 4+/5, knee ext 4+/5, ankle DF 5/5. RLE Sensation: decreased light touch;history of peripheral neuropathy LLE Deficits / Details: Hip flexion 4+/5, knee ext 4/5, ankle DF 5/5 LLE Sensation: decreased light touch;history of peripheral neuropathy    Cervical / Trunk Assessment Cervical / Trunk Assessment: Normal  Communication   Communication Communication: No apparent difficulties    Cognition Arousal: Alert Behavior During Therapy: WFL for tasks assessed/performed   PT - Cognitive impairments: Orientation   Orientation impairments: Time (month and year)      PT - Cognition Comments: Reports feeling off with cognition. Unable to elaborate further Following commands: Intact       Cueing Cueing Techniques: Verbal cues     General Comments General comments (skin integrity, edema, etc.): Wife present and supportive during session. Resting tremors in R UE,  intentional tremors in B UE     PT Assessment Patient needs continued PT services  PT Problem List Decreased strength;Decreased activity tolerance;Decreased balance;Decreased mobility;Decreased coordination;Decreased cognition       PT Treatment Interventions DME instruction;Gait training;Stair training;Functional mobility training;Therapeutic activities;Therapeutic exercise;Balance training;Neuromuscular re-education;Patient/family education    PT Goals (Current goals can be found in the Care Plan section)  Acute Rehab PT Goals Patient Stated Goal: to go home PT Goal Formulation: With patient/family Time For Goal Achievement: 06/23/24 Potential to Achieve Goals: Good    Frequency Min 2X/week     Co-evaluation   Reason for Co-Treatment: For patient/therapist safety;To address functional/ADL transfers PT goals addressed during session: Mobility/safety with mobility;Balance;Proper use of DME         AM-PAC PT 6 Clicks Mobility  Outcome Measure Help needed turning from your back to your side while in a flat bed without using bedrails?: A Little Help needed moving from lying on your back to sitting on the side of a flat bed without using bedrails?: A Little Help needed moving to and from a bed to a chair (including a wheelchair)?: A Little Help needed standing up from a chair using your arms (e.g., wheelchair or bedside chair)?: A Little Help needed to walk in hospital room?: A Little Help needed climbing 3-5 steps with a railing? : A Little 6 Click Score: 18    End of Session Equipment Utilized During Treatment: Gait belt Activity Tolerance: Patient tolerated treatment well Patient left: in bed;with call bell/phone within reach;with bed alarm set;with family/visitor present Nurse Communication: Mobility status PT Visit Diagnosis: Unsteadiness on feet (R26.81);Other abnormalities of gait and mobility (R26.89);Muscle weakness (generalized) (M62.81);History of falling  (Z91.81)    Time: 9064-8994 PT Time Calculation (min) (  ACUTE ONLY): 30 min   Charges:   PT Evaluation $PT Eval Low Complexity: 1 Low   PT General Charges $$ ACUTE PT VISIT: 1 Visit        Charles Huynh, PT, DPT Secure Chat Preferred  Rehab Office [phone]   Charles Huynh Wendolyn 06/09/2024, 10:19 AM

## 2024-06-09 NOTE — TOC CM/SW Note (Signed)
    Durable Medical Equipment  (From admission, onward)           Start     Ordered   06/09/24 1016  For home use only DME 3 n 1  Once        06/09/24 1016   06/09/24 1014  For home use only DME Bedside commode  Once       Question:  Patient needs a bedside commode to treat with the following condition  Answer:  Balance disorder   06/09/24 1013           Patient needs 3 in 1 / bedside commode due to being confined to a room with no bathroom

## 2024-06-11 LAB — VITAMIN B1: Vitamin B1 (Thiamine): 119.9 nmol/L (ref 66.5–200.0)

## 2024-06-12 LAB — CULTURE, BLOOD (ROUTINE X 2)
Culture: NO GROWTH
Culture: NO GROWTH
Special Requests: ADEQUATE
